# Patient Record
Sex: Female | Born: 1965 | Race: White | Hispanic: No | Marital: Married | Smoking: Former smoker
Health system: Southern US, Community
[De-identification: ages and names within clinical notes are randomized; demographics above are authoritative.]

## PROBLEM LIST (undated history)

## (undated) DIAGNOSIS — M810 Age-related osteoporosis without current pathological fracture: Secondary | ICD-10-CM

## (undated) DIAGNOSIS — F329 Major depressive disorder, single episode, unspecified: Secondary | ICD-10-CM

## (undated) DIAGNOSIS — F419 Anxiety disorder, unspecified: Secondary | ICD-10-CM

## (undated) DIAGNOSIS — F32A Depression, unspecified: Secondary | ICD-10-CM

## (undated) HISTORY — PX: NECK SURGERY: SHX720

## (undated) HISTORY — PX: ENDOMETRIAL ABLATION: SHX621

## (undated) HISTORY — PX: BACK SURGERY: SHX140

## (undated) HISTORY — DX: Major depressive disorder, single episode, unspecified: F32.9

## (undated) HISTORY — DX: Anxiety disorder, unspecified: F41.9

## (undated) HISTORY — DX: Age-related osteoporosis without current pathological fracture: M81.0

## (undated) HISTORY — PX: GALLBLADDER SURGERY: SHX652

## (undated) HISTORY — DX: Depression, unspecified: F32.A

## (undated) HISTORY — PX: HYSTEROTOMY: SHX1776

## (undated) HISTORY — PX: KNEE SURGERY: SHX244

---

## 2001-02-07 ENCOUNTER — Other Ambulatory Visit: Admission: RE | Admit: 2001-02-07 | Discharge: 2001-02-07 | Payer: Self-pay | Admitting: Obstetrics and Gynecology

## 2018-02-08 ENCOUNTER — Ambulatory Visit (INDEPENDENT_AMBULATORY_CARE_PROVIDER_SITE_OTHER): Payer: BLUE CROSS/BLUE SHIELD | Admitting: Obstetrics & Gynecology

## 2018-02-08 ENCOUNTER — Encounter (INDEPENDENT_AMBULATORY_CARE_PROVIDER_SITE_OTHER): Payer: Self-pay

## 2018-02-08 ENCOUNTER — Encounter: Payer: Self-pay | Admitting: Obstetrics & Gynecology

## 2018-02-08 VITALS — BP 129/77 | HR 96 | Ht 65.5 in | Wt 141.5 lb

## 2018-02-08 DIAGNOSIS — N816 Rectocele: Secondary | ICD-10-CM

## 2018-02-08 DIAGNOSIS — N8110 Cystocele, unspecified: Secondary | ICD-10-CM | POA: Diagnosis not present

## 2018-02-08 NOTE — Progress Notes (Signed)
Chief Complaint  Patient presents with  . thinks bladder has fallen    low back pain      52 y.o. [id number] No LMP recorded. Patient has had an ablation. The current method of family planning is status post menopause  Outpatient Encounter Medications as of 02/08/2018  Medication Sig  . HYDROcodone-acetaminophen (NORCO) 10-325 MG tablet Take 0.5 tablets by mouth as needed.    No facility-administered encounter medications on file as of 02/08/2018.     Subjective Pt presents with 2-3 month history of feeling like something is falling [place] Sp[place]iel was told years ago she had "prolapse" Has seen something she thinks is her bladder No dysparunia No bleeding She went through some vasomotor about 10 years ago Does not lose urine frequently, once a week or so Past Medical History:  Diagnosis Date  . Anxiety   . Depression   . Osteoporosis     Past Surgical History:  Procedure Laterality Date  . ABLATION    . BACK SURGERY     x 2  . GALLBLADDER SURGERY    . KNEE SURGERY Left   . NECK SURGERY      OB History    Gravida  4   Para  4   Term  3   Preterm  1   AB      Living  4     SAB      TAB      Ectopic      Multiple      Live Births  4           No Known Allergies  Social History   Socioeconomic History  . Marital status: Married    Spouse name: Not on file  . Number of children: Not on file  . Years of education: Not on file  . Highest education level: Not on file  Occupational History  . Not on file  Social Needs  . Financial resource strain: Not on file  . Food insecurity:    Worry: Not on file    Inability: Not on file  . Transportation needs:    Medical: Not on file    Non-medical: Not on file  Tobacco Use  . Smoking status: Current Every Day Smoker    Years: 30.00    Types: Cigarettes  . Smokeless tobacco: Never Used  . Tobacco comment: smokes 5 cig a day  Substance and Sexual Activity  . Alcohol use: Not Currently  . Drug  use: Not Currently    Types: Marijuana  . Sexual activity: Not Currently    Birth control/protection: Surgical    Comment: ablation  Lifestyle  . Physical activity:    Days per week: Not on file    Minutes per session: Not on file  . Stress: Not on file  Relationships  . Social connections:    Talks on phone: Not on file    Gets together: Not on file    Attends religious service: Not on file    Active member of club or organization: Not on file    Attends meetings of clubs or organizations: Not on file    Relationship status: Not on file  Other Topics Concern  . Not on file  Social History Narrative  . Not on file    Family History  Problem Relation Age of Onset  . Cancer Paternal Grandfather   . Heart attack Maternal Grandmother   . Heart attack Maternal Grandfather   .  Other Father        back problems  . Alzheimer's disease Mother     Medications:       Current Outpatient Medications:  .  HYDROcodone-acetaminophen (NORCO) 10-325 MG tablet, Take 0.5 tablets by mouth as needed. , Disp: , Rfl:   Objective Blood pressure 129/77, pulse 96, height 5' 5.5" (1.664 m), weight 141 lb 8 oz (64.2 kg).  General WDWN female NAD Vulva:  normal appearing vulva with no masses, tenderness or lesions Vagina:  normal mucosa, no lesions or discharge noted, grade II-III cystocoele and grade II-III rectoceoele Cervix:  no lesions and nulliparous appearance Uterus:  normal size, contour, position, consistency, mobility, non-tender Adnexa: ovaries:present,  normal adnexa in size, nontender and no masses Pessary fitting performed Milex ring with support #5 is best  Pertinent ROS No burning with urination, frequency or urgency No nausea, vomiting or diarrhea Nor fever chills or other constitutional symptoms   Labs or studies     Impression Diagnoses this Encounter::   ICD-10-CM   1. 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   Labs or Scans Ordered: No orders of the defined types were placed in this encounter.   Management:: >fit for pessary Milex ring with support #5  Follow up Return in about 1 month (around 03/11/2018) for Follow up, with Dr Despina Hidden.     All questions were answered.

## 2018-03-14 ENCOUNTER — Ambulatory Visit: Payer: BLUE CROSS/BLUE SHIELD | Admitting: Obstetrics & Gynecology

## 2018-03-15 ENCOUNTER — Ambulatory Visit: Payer: BLUE CROSS/BLUE SHIELD | Admitting: Obstetrics & Gynecology

## 2019-03-14 ENCOUNTER — Other Ambulatory Visit: Payer: Self-pay

## 2019-03-14 DIAGNOSIS — Z20822 Contact with and (suspected) exposure to covid-19: Secondary | ICD-10-CM

## 2019-03-16 LAB — NOVEL CORONAVIRUS, NAA: SARS-CoV-2, NAA: NOT DETECTED

## 2019-03-20 ENCOUNTER — Telehealth: Payer: Self-pay | Admitting: *Deleted

## 2019-03-20 NOTE — Telephone Encounter (Signed)
Patient called and was given negative COVID -19 results  

## 2019-09-11 ENCOUNTER — Other Ambulatory Visit: Payer: Self-pay | Admitting: Neurosurgery

## 2019-09-11 ENCOUNTER — Telehealth: Payer: Self-pay | Admitting: Nurse Practitioner

## 2019-09-11 DIAGNOSIS — M79601 Pain in right arm: Secondary | ICD-10-CM

## 2019-09-11 NOTE — Telephone Encounter (Signed)
Phone call to patient to verify medication list and allergies for myelogram procedure. Pt aware she will not need to hold any medications for this procedure. Pre and post procedure instructions reviewed with pt. Pt verbalized understanding. 

## 2019-09-20 ENCOUNTER — Ambulatory Visit
Admission: RE | Admit: 2019-09-20 | Discharge: 2019-09-20 | Disposition: A | Discharge status: Home / Self Care | Payer: BC Managed Care – PPO | Source: Ambulatory Visit | Attending: Neurosurgery | Admitting: Neurosurgery

## 2019-09-20 DIAGNOSIS — M79601 Pain in right arm: Secondary | ICD-10-CM

## 2019-09-20 MED ORDER — ONDANSETRON HCL 4 MG/2ML IJ SOLN
4.0000 mg | Freq: Once | INTRAMUSCULAR | Status: AC
  Administered 2019-09-20: 4 mg via INTRAMUSCULAR

## 2019-09-20 MED ORDER — DIAZEPAM 5 MG PO TABS
5.0000 mg | ORAL_TABLET | Freq: Once | ORAL | Status: AC
  Administered 2019-09-20: 5 mg via ORAL

## 2019-09-20 MED ORDER — MEPERIDINE HCL 100 MG/ML IJ SOLN
75.0000 mg | Freq: Once | INTRAMUSCULAR | Status: AC
  Administered 2019-09-20: 75 mg via INTRAMUSCULAR

## 2019-09-20 MED ORDER — IOPAMIDOL (ISOVUE-M 300) INJECTION 61%
10.0000 mL | Freq: Once | INTRAMUSCULAR | Status: AC | PRN
  Administered 2019-09-20: 10 mL via INTRATHECAL

## 2019-09-20 NOTE — Discharge Instructions (Signed)

## 2020-03-07 DIAGNOSIS — M5412 Radiculopathy, cervical region: Secondary | ICD-10-CM | POA: Diagnosis not present

## 2020-03-07 DIAGNOSIS — M5459 Other low back pain: Secondary | ICD-10-CM | POA: Diagnosis not present

## 2020-03-08 DIAGNOSIS — M5412 Radiculopathy, cervical region: Secondary | ICD-10-CM | POA: Diagnosis not present

## 2020-03-08 DIAGNOSIS — M5459 Other low back pain: Secondary | ICD-10-CM | POA: Diagnosis not present

## 2020-03-13 DIAGNOSIS — M5412 Radiculopathy, cervical region: Secondary | ICD-10-CM | POA: Diagnosis not present

## 2020-03-13 DIAGNOSIS — M5459 Other low back pain: Secondary | ICD-10-CM | POA: Diagnosis not present

## 2020-03-15 DIAGNOSIS — M5459 Other low back pain: Secondary | ICD-10-CM | POA: Diagnosis not present

## 2020-03-15 DIAGNOSIS — M5412 Radiculopathy, cervical region: Secondary | ICD-10-CM | POA: Diagnosis not present

## 2020-03-22 DIAGNOSIS — M5412 Radiculopathy, cervical region: Secondary | ICD-10-CM | POA: Diagnosis not present

## 2020-03-22 DIAGNOSIS — M5459 Other low back pain: Secondary | ICD-10-CM | POA: Diagnosis not present

## 2020-03-26 DIAGNOSIS — M5412 Radiculopathy, cervical region: Secondary | ICD-10-CM | POA: Diagnosis not present

## 2020-03-26 DIAGNOSIS — M5459 Other low back pain: Secondary | ICD-10-CM | POA: Diagnosis not present

## 2020-04-04 DIAGNOSIS — Z1211 Encounter for screening for malignant neoplasm of colon: Secondary | ICD-10-CM | POA: Diagnosis not present

## 2020-04-09 DIAGNOSIS — Z1231 Encounter for screening mammogram for malignant neoplasm of breast: Secondary | ICD-10-CM | POA: Diagnosis not present

## 2020-04-15 DIAGNOSIS — M5459 Other low back pain: Secondary | ICD-10-CM | POA: Diagnosis not present

## 2020-04-15 DIAGNOSIS — M5412 Radiculopathy, cervical region: Secondary | ICD-10-CM | POA: Diagnosis not present

## 2020-04-22 DIAGNOSIS — M5412 Radiculopathy, cervical region: Secondary | ICD-10-CM | POA: Diagnosis not present

## 2020-04-22 DIAGNOSIS — M5459 Other low back pain: Secondary | ICD-10-CM | POA: Diagnosis not present

## 2020-07-03 DIAGNOSIS — D2262 Melanocytic nevi of left upper limb, including shoulder: Secondary | ICD-10-CM | POA: Diagnosis not present

## 2020-07-03 DIAGNOSIS — D485 Neoplasm of uncertain behavior of skin: Secondary | ICD-10-CM | POA: Diagnosis not present

## 2020-07-03 DIAGNOSIS — D224 Melanocytic nevi of scalp and neck: Secondary | ICD-10-CM | POA: Diagnosis not present

## 2020-07-03 DIAGNOSIS — D239 Other benign neoplasm of skin, unspecified: Secondary | ICD-10-CM | POA: Diagnosis not present

## 2020-07-31 DIAGNOSIS — M4802 Spinal stenosis, cervical region: Secondary | ICD-10-CM | POA: Diagnosis not present

## 2020-07-31 DIAGNOSIS — M5412 Radiculopathy, cervical region: Secondary | ICD-10-CM | POA: Diagnosis not present

## 2020-08-08 ENCOUNTER — Other Ambulatory Visit: Payer: Self-pay | Admitting: Orthopedic Surgery

## 2020-08-08 ENCOUNTER — Other Ambulatory Visit: Payer: Self-pay | Admitting: Family Medicine

## 2020-08-08 DIAGNOSIS — M4802 Spinal stenosis, cervical region: Secondary | ICD-10-CM

## 2020-08-08 DIAGNOSIS — M5412 Radiculopathy, cervical region: Secondary | ICD-10-CM

## 2020-08-12 ENCOUNTER — Telehealth: Payer: Self-pay

## 2020-08-12 ENCOUNTER — Other Ambulatory Visit: Payer: Self-pay | Admitting: Orthopedic Surgery

## 2020-08-12 DIAGNOSIS — M4802 Spinal stenosis, cervical region: Secondary | ICD-10-CM

## 2020-08-12 NOTE — Telephone Encounter (Signed)
Phone call to patient to verify medication list and allergies for myelogram procedure. Pt instructed to hold tramadol  for 48hrs prior to myelogram appointment time and 24 hours after appointment. Pt also instructed to have a driver the day of the procedure, the procedure would take around 2 hours, and discharge instructions discussed. Pt verbalized understanding.

## 2020-08-15 ENCOUNTER — Ambulatory Visit
Admission: RE | Admit: 2020-08-15 | Discharge: 2020-08-15 | Disposition: A | Discharge status: Home / Self Care | Payer: BC Managed Care – PPO | Source: Ambulatory Visit | Attending: Orthopedic Surgery | Admitting: Orthopedic Surgery

## 2020-08-15 ENCOUNTER — Other Ambulatory Visit: Payer: Self-pay

## 2020-08-15 DIAGNOSIS — M4802 Spinal stenosis, cervical region: Secondary | ICD-10-CM

## 2020-08-15 DIAGNOSIS — M5124 Other intervertebral disc displacement, thoracic region: Secondary | ICD-10-CM | POA: Diagnosis not present

## 2020-08-15 DIAGNOSIS — M4322 Fusion of spine, cervical region: Secondary | ICD-10-CM | POA: Diagnosis not present

## 2020-08-15 DIAGNOSIS — M47812 Spondylosis without myelopathy or radiculopathy, cervical region: Secondary | ICD-10-CM | POA: Diagnosis not present

## 2020-08-15 DIAGNOSIS — M546 Pain in thoracic spine: Secondary | ICD-10-CM | POA: Diagnosis not present

## 2020-08-15 MED ORDER — ONDANSETRON HCL 4 MG/2ML IJ SOLN
4.0000 mg | Freq: Once | INTRAMUSCULAR | Status: AC
  Administered 2020-08-15: 4 mg via INTRAMUSCULAR

## 2020-08-15 MED ORDER — MEPERIDINE HCL 50 MG/ML IJ SOLN
50.0000 mg | Freq: Once | INTRAMUSCULAR | Status: AC
  Administered 2020-08-15: 50 mg via INTRAMUSCULAR

## 2020-08-15 MED ORDER — DIAZEPAM 5 MG PO TABS
10.0000 mg | ORAL_TABLET | Freq: Once | ORAL | Status: AC
  Administered 2020-08-15: 10 mg via ORAL

## 2020-08-15 MED ORDER — IOPAMIDOL (ISOVUE-M 300) INJECTION 61%
10.0000 mL | Freq: Once | INTRAMUSCULAR | Status: AC | PRN
  Administered 2020-08-15: 10 mL via INTRATHECAL

## 2020-08-15 NOTE — Discharge Instructions (Signed)
Myelogram Discharge Instructions  1. Go home and rest quietly for the next 24 hours.  It is important to lie flat for the next 24 hours.  Get up only to go to the restroom.  You may lie in the bed or on a couch on your back, your stomach, your left side or your right side.  You may have one pillow under your head.  You may have pillows between your knees while you are on your side or under your knees while you are on your back.  2. DO NOT drive today.  Recline the seat as far back as it will go, while still wearing your seat belt, on the way home.  3. You may get up to go to the bathroom as needed.  You may sit up for 10 minutes to eat.  You may resume your normal diet and medications unless otherwise indicated.  Drink lots of extra fluids today and tomorrow.  4. The incidence of headache, nausea, or vomiting is about 5% (one in 20 patients).  If you develop a headache, lie flat and drink plenty of fluids until the headache goes away.  Caffeinated beverages may be helpful.  If you develop severe nausea and vomiting or a headache that does not go away with flat bed rest, call [phone].  5. You may resume normal activities after your 24 hours of bed rest is over; however, do not exert yourself strongly or do any heavy lifting tomorrow. If when you get up you have a headache when standing, go back to bed and force fluids for another 24 hours.  6. Call your physician for a follow-up appointment.  The results of your myelogram will be sent directly to your physician by the following day.  7. If you have any questions or if complications develop after you arrive home, please call [phone].  Discharge instructions have been explained to the patient.  The patient, or the person responsible for the patient, fully understands these instructions   YOU MAY RESUME YOUR TRAMADOL TOMORROW 08/16/20 AT 1:30 PM

## 2020-08-15 NOTE — Discharge Instr - Other Orders (Signed)
Pt reported pain 7/10 prior to starting myelogram procedure. Pt reported this pain to Dr. Mosetta Putt. Pain medication was given prior to starting myelogram procedure. See MAR.

## 2020-08-15 NOTE — Progress Notes (Signed)
Pt reports she has been off of her Tramadol for at least 48 hours.  

## 2020-09-16 DIAGNOSIS — M84622A Pathological fracture in other disease, left humerus, initial encounter for fracture: Secondary | ICD-10-CM | POA: Diagnosis not present

## 2020-11-12 DIAGNOSIS — M542 Cervicalgia: Secondary | ICD-10-CM | POA: Diagnosis not present

## 2020-11-12 DIAGNOSIS — Z299 Encounter for prophylactic measures, unspecified: Secondary | ICD-10-CM | POA: Diagnosis not present

## 2020-11-25 DIAGNOSIS — M542 Cervicalgia: Secondary | ICD-10-CM | POA: Diagnosis not present

## 2020-11-25 DIAGNOSIS — M5031 Other cervical disc degeneration,  high cervical region: Secondary | ICD-10-CM | POA: Diagnosis not present

## 2020-11-25 DIAGNOSIS — M50221 Other cervical disc displacement at C4-C5 level: Secondary | ICD-10-CM | POA: Diagnosis not present

## 2020-11-25 DIAGNOSIS — Z981 Arthrodesis status: Secondary | ICD-10-CM | POA: Diagnosis not present

## 2020-12-05 DIAGNOSIS — M5030 Other cervical disc degeneration, unspecified cervical region: Secondary | ICD-10-CM | POA: Diagnosis not present

## 2020-12-05 DIAGNOSIS — M961 Postlaminectomy syndrome, not elsewhere classified: Secondary | ICD-10-CM | POA: Diagnosis not present

## 2020-12-05 DIAGNOSIS — M5412 Radiculopathy, cervical region: Secondary | ICD-10-CM | POA: Diagnosis not present

## 2020-12-06 DIAGNOSIS — M5187 Other intervertebral disc disorders, lumbosacral region: Secondary | ICD-10-CM | POA: Diagnosis not present

## 2020-12-06 DIAGNOSIS — M4816 Ankylosing hyperostosis [Forestier], lumbar region: Secondary | ICD-10-CM | POA: Diagnosis not present

## 2020-12-06 DIAGNOSIS — M5416 Radiculopathy, lumbar region: Secondary | ICD-10-CM | POA: Diagnosis not present

## 2020-12-06 DIAGNOSIS — M5136 Other intervertebral disc degeneration, lumbar region: Secondary | ICD-10-CM | POA: Diagnosis not present

## 2020-12-06 DIAGNOSIS — M50123 Cervical disc disorder at C6-C7 level with radiculopathy: Secondary | ICD-10-CM | POA: Diagnosis not present

## 2021-01-13 DIAGNOSIS — R5383 Other fatigue: Secondary | ICD-10-CM | POA: Diagnosis not present

## 2021-01-13 DIAGNOSIS — Z1331 Encounter for screening for depression: Secondary | ICD-10-CM | POA: Diagnosis not present

## 2021-01-13 DIAGNOSIS — F17210 Nicotine dependence, cigarettes, uncomplicated: Secondary | ICD-10-CM | POA: Diagnosis not present

## 2021-01-13 DIAGNOSIS — Z299 Encounter for prophylactic measures, unspecified: Secondary | ICD-10-CM | POA: Diagnosis not present

## 2021-01-13 DIAGNOSIS — F319 Bipolar disorder, unspecified: Secondary | ICD-10-CM | POA: Diagnosis not present

## 2021-01-13 DIAGNOSIS — E782 Mixed hyperlipidemia: Secondary | ICD-10-CM | POA: Diagnosis not present

## 2021-01-13 DIAGNOSIS — Z23 Encounter for immunization: Secondary | ICD-10-CM | POA: Diagnosis not present

## 2021-01-13 DIAGNOSIS — Z0000 Encounter for general adult medical examination without abnormal findings: Secondary | ICD-10-CM | POA: Diagnosis not present

## 2021-01-13 DIAGNOSIS — Z79899 Other long term (current) drug therapy: Secondary | ICD-10-CM | POA: Diagnosis not present

## 2021-01-20 DIAGNOSIS — R300 Dysuria: Secondary | ICD-10-CM | POA: Diagnosis not present

## 2021-02-06 DIAGNOSIS — Z1331 Encounter for screening for depression: Secondary | ICD-10-CM | POA: Diagnosis not present

## 2021-02-06 DIAGNOSIS — M549 Dorsalgia, unspecified: Secondary | ICD-10-CM | POA: Diagnosis not present

## 2021-02-06 DIAGNOSIS — M542 Cervicalgia: Secondary | ICD-10-CM | POA: Diagnosis not present

## 2021-02-06 DIAGNOSIS — E7800 Pure hypercholesterolemia, unspecified: Secondary | ICD-10-CM | POA: Diagnosis not present

## 2021-02-06 DIAGNOSIS — Z299 Encounter for prophylactic measures, unspecified: Secondary | ICD-10-CM | POA: Diagnosis not present

## 2021-02-06 DIAGNOSIS — F17210 Nicotine dependence, cigarettes, uncomplicated: Secondary | ICD-10-CM | POA: Diagnosis not present

## 2021-02-06 DIAGNOSIS — F339 Major depressive disorder, recurrent, unspecified: Secondary | ICD-10-CM | POA: Diagnosis not present

## 2021-03-05 DIAGNOSIS — Z6823 Body mass index (BMI) 23.0-23.9, adult: Secondary | ICD-10-CM | POA: Diagnosis not present

## 2021-03-05 DIAGNOSIS — Z981 Arthrodesis status: Secondary | ICD-10-CM | POA: Diagnosis not present

## 2021-03-05 DIAGNOSIS — M545 Low back pain, unspecified: Secondary | ICD-10-CM | POA: Diagnosis not present

## 2021-03-05 DIAGNOSIS — M542 Cervicalgia: Secondary | ICD-10-CM | POA: Diagnosis not present

## 2021-03-20 DIAGNOSIS — M542 Cervicalgia: Secondary | ICD-10-CM | POA: Diagnosis not present

## 2021-03-20 DIAGNOSIS — M5416 Radiculopathy, lumbar region: Secondary | ICD-10-CM | POA: Diagnosis not present

## 2021-03-20 DIAGNOSIS — M545 Low back pain, unspecified: Secondary | ICD-10-CM | POA: Diagnosis not present

## 2021-04-02 DIAGNOSIS — M5416 Radiculopathy, lumbar region: Secondary | ICD-10-CM | POA: Diagnosis not present

## 2021-04-17 DIAGNOSIS — M5416 Radiculopathy, lumbar region: Secondary | ICD-10-CM | POA: Diagnosis not present

## 2021-04-17 DIAGNOSIS — M47812 Spondylosis without myelopathy or radiculopathy, cervical region: Secondary | ICD-10-CM | POA: Diagnosis not present

## 2021-05-06 DIAGNOSIS — M47812 Spondylosis without myelopathy or radiculopathy, cervical region: Secondary | ICD-10-CM | POA: Diagnosis not present

## 2021-05-21 DIAGNOSIS — M542 Cervicalgia: Secondary | ICD-10-CM | POA: Diagnosis not present

## 2021-05-21 DIAGNOSIS — M549 Dorsalgia, unspecified: Secondary | ICD-10-CM | POA: Diagnosis not present

## 2021-05-21 DIAGNOSIS — Z299 Encounter for prophylactic measures, unspecified: Secondary | ICD-10-CM | POA: Diagnosis not present

## 2021-05-21 DIAGNOSIS — Z6824 Body mass index (BMI) 24.0-24.9, adult: Secondary | ICD-10-CM | POA: Diagnosis not present

## 2021-05-21 DIAGNOSIS — R110 Nausea: Secondary | ICD-10-CM | POA: Diagnosis not present

## 2021-05-21 DIAGNOSIS — F17210 Nicotine dependence, cigarettes, uncomplicated: Secondary | ICD-10-CM | POA: Diagnosis not present

## 2021-05-21 DIAGNOSIS — K219 Gastro-esophageal reflux disease without esophagitis: Secondary | ICD-10-CM | POA: Diagnosis not present

## 2021-05-22 ENCOUNTER — Encounter (INDEPENDENT_AMBULATORY_CARE_PROVIDER_SITE_OTHER): Payer: Self-pay | Admitting: *Deleted

## 2021-05-26 DIAGNOSIS — M5416 Radiculopathy, lumbar region: Secondary | ICD-10-CM | POA: Diagnosis not present

## 2021-06-04 DIAGNOSIS — M47812 Spondylosis without myelopathy or radiculopathy, cervical region: Secondary | ICD-10-CM | POA: Diagnosis not present

## 2021-07-07 DIAGNOSIS — L570 Actinic keratosis: Secondary | ICD-10-CM | POA: Diagnosis not present

## 2021-07-07 DIAGNOSIS — D239 Other benign neoplasm of skin, unspecified: Secondary | ICD-10-CM | POA: Diagnosis not present

## 2021-07-21 ENCOUNTER — Ambulatory Visit (INDEPENDENT_AMBULATORY_CARE_PROVIDER_SITE_OTHER): Payer: BC Managed Care – PPO | Admitting: Gastroenterology

## 2021-07-21 ENCOUNTER — Other Ambulatory Visit: Payer: Self-pay

## 2021-07-21 ENCOUNTER — Encounter (INDEPENDENT_AMBULATORY_CARE_PROVIDER_SITE_OTHER): Payer: Self-pay | Admitting: Gastroenterology

## 2021-07-21 ENCOUNTER — Other Ambulatory Visit (INDEPENDENT_AMBULATORY_CARE_PROVIDER_SITE_OTHER): Payer: Self-pay

## 2021-07-21 ENCOUNTER — Encounter (INDEPENDENT_AMBULATORY_CARE_PROVIDER_SITE_OTHER): Payer: Self-pay

## 2021-07-21 VITALS — BP 121/74 | HR 102 | Temp 98.8°F | Ht 65.5 in | Wt 148.2 lb

## 2021-07-21 DIAGNOSIS — R112 Nausea with vomiting, unspecified: Secondary | ICD-10-CM | POA: Insufficient documentation

## 2021-07-21 DIAGNOSIS — R110 Nausea: Secondary | ICD-10-CM | POA: Insufficient documentation

## 2021-07-21 DIAGNOSIS — R142 Eructation: Secondary | ICD-10-CM | POA: Diagnosis not present

## 2021-07-21 DIAGNOSIS — R1013 Epigastric pain: Secondary | ICD-10-CM | POA: Diagnosis not present

## 2021-07-21 NOTE — Patient Instructions (Signed)
Please continue your protonix 40mg  twice a day at this time ?Please avoid NSAIDs (advil, aleve, naproxen, goody powder, ibuprofen) as these can be very hard on your GI tract, causing inflammation, ulcers and damage to the lining of your GI tract.  ?We will get you scheduled for an upper endoscopy for further evaluation of your symptoms, it is possible you have an ulcer or gastritis.  ?I will also obtain records from your previous colonoscopy so that we can make sure you are up to date on this ?

## 2021-07-21 NOTE — Progress Notes (Signed)
? ?Referring Provider: Ignatius Specking, MD ?Primary Care Physician:  Ignatius Specking, MD ?Primary GI Physician: new ? ?Chief Complaint  ?Patient presents with  ? Nausea  ?  Patient referred for nausea, gas, burping. Taking pantoprazole 40mg  bid.   ? ?HPI:   ?Kathryn Berry is a 56 y.o. female with past medical history of anxiety, depression, osteoporosis. ? ?Patient presenting today as new patient for nausea, epigastric discomfort and belching. ? ?Patient states for the past 8 months she has had some nausea, epigastric discomfort and belching. Has started a probiotic and drinking more water which she feels may have helped some. She also cut out sugar and feels that symptoms have improved some over the past week since doing that. She denies any abdominal pain but notes a discomfort in her epigastric region. States that eating tends to make her symptoms better. She also notes more belching at times. Denies actual heartburn or acid regurgitation. Denies constipation or diarrhea, rectal bleeding, melena, dysphagia, odynophagia early satiety or bloating. States She was not taking any medications when symptoms began. She has had no episodes of vomiting. Symptoms seems to be worse at night when lying down so she has elevated HOB at night. Does not note much change in symptoms since starting pantoprazole, PCP recently increased to BID to see if this provided any results. She states that she recently quit smoking 2 months ago, does note that she feels a slight sore throat at times and notes some drainage in the back of her throat. Does not feel that certain foods tend to worsen her symptoms. ? ?NSAID use: no frequent NSAID use ?Social hx: recently stopped smoking, does not drink alcohol. ?Fam 41 grandfather had colon cancer ? ?Last Colonoscopy:04/04/20 [facility], unable to review records in EMR ?Last Endoscopy:never ? ? ?Past Medical History:  ?Diagnosis Date  ? Anxiety   ? Depression   ? Osteoporosis   ? ?Past  Surgical History:  ?Procedure Laterality Date  ? BACK SURGERY    ? x 3  ? ENDOMETRIAL ABLATION    ? GALLBLADDER SURGERY    ? HYSTEROTOMY    ? KNEE SURGERY Left   ? NECK SURGERY    ? x 3  ? ? ?Current Outpatient Medications  ?Medication Sig Dispense Refill  ? pantoprazole (PROTONIX) 40 MG tablet Take 40 mg by mouth 2 (two) times daily.    ? pregabalin (LYRICA) 50 MG capsule Take by mouth. Takes 3 daily    ? Probiotic Product (PROBIOTIC DAILY PO) Take by mouth. One daily    ? ?No current facility-administered medications for this visit.  ? ? ?Allergies as of 07/21/2021  ? (No Known Allergies)  ? ?Family History  ?Problem Relation Age of Onset  ? Cancer Paternal Grandfather   ? Heart attack Maternal Grandmother   ? Heart attack Maternal Grandfather   ? Other Father   ?     back problems  ? Alzheimer's disease Mother   ? ?Social History  ? ?Socioeconomic History  ? Marital status: Married  ?  Spouse name: Not on file  ? Number of children: Not on file  ? Years of education: Not on file  ? Highest education level: Not on file  ?Occupational History  ? Not on file  ?Tobacco Use  ? Smoking status: Former  ?  Years: 30.00  ?  Types: Cigarettes  ?  Quit date: 06/2021  ?  Years since quitting: 0.1  ?  Passive exposure: Past  ?  Smokeless tobacco: Never  ?Substance and Sexual Activity  ? Alcohol use: Not Currently  ? Drug use: Not Currently  ?  Types: Marijuana  ? Sexual activity: Not Currently  ?  Birth control/protection: Surgical  ?  Comment: ablation  ?Other Topics Concern  ? Not on file  ?Social History Narrative  ? Not on file  ? ?Social Determinants of Health  ? ?Financial Resource Strain: Not on file  ?Food Insecurity: Not on file  ?Transportation Needs: Not on file  ?Physical Activity: Not on file  ?Stress: Not on file  ?Social Connections: Not on file  ? ?Review of systems ?General: negative for malaise, night sweats, fever, chills, weight loss ?Neck: Negative for lumps, goiter, pain and significant neck  swelling ?Resp: Negative for cough, wheezing, dyspnea at rest ?CV: Negative for chest pain, leg swelling, palpitations, orthopnea ?GI: denies melena, hematochezia, vomiting, diarrhea, constipation, dysphagia, odyonophagia, early satiety or unintentional weight loss. +nausea +epigastric discomfort ?MSK: Negative for joint pain or swelling, back pain, and muscle pain. ?Derm: Negative for itching or rash ?Psych: Denies depression, anxiety, memory loss, confusion. No homicidal or suicidal ideation.  ?Heme: Negative for prolonged bleeding, bruising easily, and swollen nodes. ?Endocrine: Negative for cold or heat intolerance, polyuria, polydipsia and goiter. ?Neuro: negative for tremor, gait imbalance, syncope and seizures. ?The remainder of the review of systems is noncontributory. ? ?Physical Exam: ?BP 121/74 (BP Location: Right Arm, Patient Position: Sitting, Cuff Size: Large)   Pulse (!) 102   Temp 98.8 ?F (37.1 ?C) (Oral)   Ht 5' 5.5" (1.664 m)   Wt 148 lb 3.2 oz (67.2 kg)   BMI 24.29 kg/m?  ?General:   Alert and oriented. No distress noted. Pleasant and cooperative.  ?Head:  Normocephalic and atraumatic. ?Eyes:  Conjuctiva clear without scleral icterus. ?Mouth:  Oral mucosa pink and moist. Good dentition. No lesions. ?Heart: Normal rate and rhythm, s1 and s2 heart sounds present.  ?Lungs: Clear lung sounds in all lobes. Respirations equal and unlabored. ?Abdomen:  +BS, soft, non-tender and non-distended. No rebound or guarding. No HSM or masses noted. ?Derm: No palmar erythema or jaundice ?Msk:  Symmetrical without gross deformities. Normal posture. ?Extremities:  Without edema. ?Neurologic:  Alert and  oriented x4 ?Psych:  Alert and cooperative. Normal mood and affect. ? ?Invalid input(s): 6 MONTHS  ? ?ASSESSMENT: ?Kathryn Berry is a 56 y.o. female presenting today as a new patient for epigastric discomfort, nausea and belching.  ? ?Symptoms began about 8 months ago, denies GERD symptoms though does have  some soreness in her throat on occasion and feels that she has some drainage. Epigastric pain sometimes improves with eating. Does not feel that any specific foods trigger her symptoms, though they tend to be worse at night when lying down. She does not drink or take any NSAIDs. No rectal bleeding, melena, early satiety or weight loss. Denies any dysphagia or odynophagia. Currently on PPI BID, is not sure if this has provided much improvement in symptoms, we can continue with BID dosing at this time, further recommendations to follow after endoscopic evaluation. We will schedule EGD for further evaluation, though symptoms could be related to uncontrolled GERD, gastritis or PUD. Indications, risks and benefits of procedure discussed in detail with patient. Patient verbalized understanding and is in agreement to proceed with EGD at this time.  ? ?It appears last colonoscopy was in 2021 at [facility], we will attempt to obtain these records for her EMR and ensure she is up to   date on screening as she has hx of colon polyps.  ? ? ?PLAN:  ?Obtain colonoscopy records from [facility] ?2. Continue PPI BID for now ?3. Schedule EGD ?4. Continue to Avoid NSAIDs ? ?All questions were answered, patient verbalized understanding and is in agreement with plan as outline above.  ? ?Follow Up: ?TBD ? ?Darletta Noblett L. Jeanmarie Hubert, MSN, APRN, AGNP-C ?Adult-Gerontology Nurse Practitioner ?[facility] ? ?

## 2021-07-21 NOTE — H&P (View-Only) (Signed)
? ?Referring Provider: Ignatius Specking, MD ?Primary Care Physician:  Ignatius Specking, MD ?Primary GI Physician: new ? ?Chief Complaint  ?Patient presents with  ? Nausea  ?  Patient referred for nausea, gas, burping. Taking pantoprazole 40mg  bid.   ? ?HPI:   ?Kathryn Berry is a 56 y.o. female with past medical history of anxiety, depression, osteoporosis. ? ?Patient presenting today as new patient for nausea, epigastric discomfort and belching. ? ?Patient states for the past 8 months she has had some nausea, epigastric discomfort and belching. Has started a probiotic and drinking more water which she feels may have helped some. She also cut out sugar and feels that symptoms have improved some over the past week since doing that. She denies any abdominal pain but notes a discomfort in her epigastric region. States that eating tends to make her symptoms better. She also notes more belching at times. Denies actual heartburn or acid regurgitation. Denies constipation or diarrhea, rectal bleeding, melena, dysphagia, odynophagia early satiety or bloating. States She was not taking any medications when symptoms began. She has had no episodes of vomiting. Symptoms seems to be worse at night when lying down so she has elevated HOB at night. Does not note much change in symptoms since starting pantoprazole, PCP recently increased to BID to see if this provided any results. She states that she recently quit smoking 2 months ago, does note that she feels a slight sore throat at times and notes some drainage in the back of her throat. Does not feel that certain foods tend to worsen her symptoms. ? ?NSAID use: no frequent NSAID use ?Social hx: recently stopped smoking, does not drink alcohol. ?Fam 41 grandfather had colon cancer ? ?Last Colonoscopy:04/04/20 [facility], unable to review records in EMR ?Last Endoscopy:never ? ? ?Past Medical History:  ?Diagnosis Date  ? Anxiety   ? Depression   ? Osteoporosis   ? ?Past  Surgical History:  ?Procedure Laterality Date  ? BACK SURGERY    ? x 3  ? ENDOMETRIAL ABLATION    ? GALLBLADDER SURGERY    ? HYSTEROTOMY    ? KNEE SURGERY Left   ? NECK SURGERY    ? x 3  ? ? ?Current Outpatient Medications  ?Medication Sig Dispense Refill  ? pantoprazole (PROTONIX) 40 MG tablet Take 40 mg by mouth 2 (two) times daily.    ? pregabalin (LYRICA) 50 MG capsule Take by mouth. Takes 3 daily    ? Probiotic Product (PROBIOTIC DAILY PO) Take by mouth. One daily    ? ?No current facility-administered medications for this visit.  ? ? ?Allergies as of 07/21/2021  ? (No Known Allergies)  ? ?Family History  ?Problem Relation Age of Onset  ? Cancer Paternal Grandfather   ? Heart attack Maternal Grandmother   ? Heart attack Maternal Grandfather   ? Other Father   ?     back problems  ? Alzheimer's disease Mother   ? ?Social History  ? ?Socioeconomic History  ? Marital status: Married  ?  Spouse name: Not on file  ? Number of children: Not on file  ? Years of education: Not on file  ? Highest education level: Not on file  ?Occupational History  ? Not on file  ?Tobacco Use  ? Smoking status: Former  ?  Years: 30.00  ?  Types: Cigarettes  ?  Quit date: 06/2021  ?  Years since quitting: 0.1  ?  Passive exposure: Past  ?  Smokeless tobacco: Never  ?Substance and Sexual Activity  ? Alcohol use: Not Currently  ? Drug use: Not Currently  ?  Types: Marijuana  ? Sexual activity: Not Currently  ?  Birth control/protection: Surgical  ?  Comment: ablation  ?Other Topics Concern  ? Not on file  ?Social History Narrative  ? Not on file  ? ?Social Determinants of Health  ? ?Financial Resource Strain: Not on file  ?Food Insecurity: Not on file  ?Transportation Needs: Not on file  ?Physical Activity: Not on file  ?Stress: Not on file  ?Social Connections: Not on file  ? ?Review of systems ?General: negative for malaise, night sweats, fever, chills, weight loss ?Neck: Negative for lumps, goiter, pain and significant neck  swelling ?Resp: Negative for cough, wheezing, dyspnea at rest ?CV: Negative for chest pain, leg swelling, palpitations, orthopnea ?GI: denies melena, hematochezia, vomiting, diarrhea, constipation, dysphagia, odyonophagia, early satiety or unintentional weight loss. +nausea +epigastric discomfort ?MSK: Negative for joint pain or swelling, back pain, and muscle pain. ?Derm: Negative for itching or rash ?Psych: Denies depression, anxiety, memory loss, confusion. No homicidal or suicidal ideation.  ?Heme: Negative for prolonged bleeding, bruising easily, and swollen nodes. ?Endocrine: Negative for cold or heat intolerance, polyuria, polydipsia and goiter. ?Neuro: negative for tremor, gait imbalance, syncope and seizures. ?The remainder of the review of systems is noncontributory. ? ?Physical Exam: ?BP 121/74 (BP Location: Right Arm, Patient Position: Sitting, Cuff Size: Large)   Pulse (!) 102   Temp 98.8 ?F (37.1 ?C) (Oral)   Ht 5' 5.5" (1.664 m)   Wt 148 lb 3.2 oz (67.2 kg)   BMI 24.29 kg/m?  ?General:   Alert and oriented. No distress noted. Pleasant and cooperative.  ?Head:  Normocephalic and atraumatic. ?Eyes:  Conjuctiva clear without scleral icterus. ?Mouth:  Oral mucosa pink and moist. Good dentition. No lesions. ?Heart: Normal rate and rhythm, s1 and s2 heart sounds present.  ?Lungs: Clear lung sounds in all lobes. Respirations equal and unlabored. ?Abdomen:  +BS, soft, non-tender and non-distended. No rebound or guarding. No HSM or masses noted. ?Derm: No palmar erythema or jaundice ?Msk:  Symmetrical without gross deformities. Normal posture. ?Extremities:  Without edema. ?Neurologic:  Alert and  oriented x4 ?Psych:  Alert and cooperative. Normal mood and affect. ? ?Invalid input(s): 6 MONTHS  ? ?ASSESSMENT: ?Kathryn Berry is a 56 y.o. female presenting today as a new patient for epigastric discomfort, nausea and belching.  ? ?Symptoms began about 8 months ago, denies GERD symptoms though does have  some soreness in her throat on occasion and feels that she has some drainage. Epigastric pain sometimes improves with eating. Does not feel that any specific foods trigger her symptoms, though they tend to be worse at night when lying down. She does not drink or take any NSAIDs. No rectal bleeding, melena, early satiety or weight loss. Denies any dysphagia or odynophagia. Currently on PPI BID, is not sure if this has provided much improvement in symptoms, we can continue with BID dosing at this time, further recommendations to follow after endoscopic evaluation. We will schedule EGD for further evaluation, though symptoms could be related to uncontrolled GERD, gastritis or PUD. Indications, risks and benefits of procedure discussed in detail with patient. Patient verbalized understanding and is in agreement to proceed with EGD at this time.  ? ?It appears last colonoscopy was in 2021 at [facility], we will attempt to obtain these records for her EMR and ensure she is up to  date on screening as she has hx of colon polyps.  ? ? ?PLAN:  ?Obtain colonoscopy records from [facility] ?2. Continue PPI BID for now ?3. Schedule EGD ?4. Continue to Avoid NSAIDs ? ?All questions were answered, patient verbalized understanding and is in agreement with plan as outline above.  ? ?Follow Up: ?TBD ? ?Gayle Martinez L. Jeanmarie Hubert, MSN, APRN, AGNP-C ?Adult-Gerontology Nurse Practitioner ?[facility] ? ?

## 2021-07-22 ENCOUNTER — Other Ambulatory Visit (INDEPENDENT_AMBULATORY_CARE_PROVIDER_SITE_OTHER): Payer: Self-pay

## 2021-07-22 DIAGNOSIS — Z01812 Encounter for preprocedural laboratory examination: Secondary | ICD-10-CM

## 2021-07-30 ENCOUNTER — Encounter (HOSPITAL_COMMUNITY): Payer: Self-pay | Admitting: Gastroenterology

## 2021-07-30 ENCOUNTER — Ambulatory Visit (HOSPITAL_COMMUNITY)
Admission: RE | Admit: 2021-07-30 | Discharge: 2021-07-30 | Disposition: A | Discharge status: Home / Self Care | Payer: BC Managed Care – PPO | Source: Ambulatory Visit | Attending: Gastroenterology | Admitting: Gastroenterology

## 2021-07-30 ENCOUNTER — Other Ambulatory Visit: Payer: Self-pay

## 2021-07-30 ENCOUNTER — Ambulatory Visit (HOSPITAL_COMMUNITY): Payer: BC Managed Care – PPO | Admitting: Certified Registered"

## 2021-07-30 ENCOUNTER — Encounter (HOSPITAL_COMMUNITY)
Admission: RE | Disposition: A | Discharge status: Home / Self Care | Payer: Self-pay | Source: Ambulatory Visit | Attending: Gastroenterology

## 2021-07-30 DIAGNOSIS — K31819 Angiodysplasia of stomach and duodenum without bleeding: Secondary | ICD-10-CM | POA: Diagnosis not present

## 2021-07-30 DIAGNOSIS — Z87891 Personal history of nicotine dependence: Secondary | ICD-10-CM | POA: Diagnosis not present

## 2021-07-30 DIAGNOSIS — R110 Nausea: Secondary | ICD-10-CM | POA: Diagnosis not present

## 2021-07-30 DIAGNOSIS — R1013 Epigastric pain: Secondary | ICD-10-CM | POA: Insufficient documentation

## 2021-07-30 DIAGNOSIS — R197 Diarrhea, unspecified: Secondary | ICD-10-CM | POA: Insufficient documentation

## 2021-07-30 DIAGNOSIS — K449 Diaphragmatic hernia without obstruction or gangrene: Secondary | ICD-10-CM | POA: Insufficient documentation

## 2021-07-30 HISTORY — PX: ESOPHAGOGASTRODUODENOSCOPY (EGD) WITH PROPOFOL: SHX5813

## 2021-07-30 HISTORY — PX: BIOPSY: SHX5522

## 2021-07-30 SURGERY — ESOPHAGOGASTRODUODENOSCOPY (EGD) WITH PROPOFOL
Anesthesia: General

## 2021-07-30 MED ORDER — LACTATED RINGERS IV SOLN: INTRAVENOUS | Status: DC

## 2021-07-30 MED ORDER — LIDOCAINE HCL (CARDIAC) PF 100 MG/5ML IV SOSY
PREFILLED_SYRINGE | INTRAVENOUS | Status: DC | PRN
  Administered 2021-07-30: 50 mg via INTRAVENOUS

## 2021-07-30 MED ORDER — PROPOFOL 10 MG/ML IV BOLUS
INTRAVENOUS | Status: DC | PRN
  Administered 2021-07-30: 40 mg via INTRAVENOUS
  Administered 2021-07-30: 100 mg via INTRAVENOUS

## 2021-07-30 MED ORDER — LACTATED RINGERS IV SOLN: INTRAVENOUS | Status: DC | PRN

## 2021-07-30 MED ORDER — PROPOFOL 500 MG/50ML IV EMUL
INTRAVENOUS | Status: DC | PRN
  Administered 2021-07-30: 150 ug/kg/min via INTRAVENOUS

## 2021-07-30 NOTE — Discharge Instructions (Signed)
You are being discharged to home.  Resume your previous diet.  We are waiting for your pathology results.  

## 2021-07-30 NOTE — Transfer of Care (Signed)
Immediate Anesthesia Transfer of Care Note ? ?Patient: Kathryn Berry ? ?Procedure(s) Performed: ESOPHAGOGASTRODUODENOSCOPY (EGD) WITH PROPOFOL ?BIOPSY ? ?Patient Location: Endoscopy Unit ? ?Anesthesia Type:General ? ?Level of Consciousness: drowsy ? ?Airway & Oxygen Therapy: Patient Spontanous Breathing ? ?Post-op Assessment: Report given to RN and Post -op Vital signs reviewed and stable ? ?Post vital signs: Reviewed and stable ? ?Last Vitals:  ?Vitals Value Taken Time  ?BP    ?Temp    ?Pulse    ?Resp    ?SpO2    ? ? ?Last Pain:  ?Vitals:  ? 07/30/21 0738  ?TempSrc:   ?PainSc: 0-No pain  ?   ? ?  ? ?Complications: No notable events documented. ?

## 2021-07-30 NOTE — Anesthesia Preprocedure Evaluation (Signed)
Anesthesia Evaluation  ?Patient identified by MRN, date of birth, ID band ?Patient awake ? ? ? ?Reviewed: ?Allergy & Precautions, H&P , NPO status , Patient's Chart, lab work & pertinent test results, reviewed documented beta blocker date and time  ? ?Airway ?Mallampati: II ? ?TM Distance: >3 FB ?Neck ROM: full ? ? ? Dental ?no notable dental hx. ? ?  ?Pulmonary ?neg pulmonary ROS, former smoker,  ?  ?Pulmonary exam normal ?breath sounds clear to auscultation ? ? ? ? ? ? Cardiovascular ?Exercise Tolerance: Good ?negative cardio ROS ? ? ?Rhythm:regular Rate:Normal ? ? ?  ?Neuro/Psych ?PSYCHIATRIC DISORDERS Anxiety Depression negative neurological ROS ?   ? GI/Hepatic ?negative GI ROS, Neg liver ROS,   ?Endo/Other  ?negative endocrine ROS ? Renal/GU ?negative Renal ROS  ?negative genitourinary ?  ?Musculoskeletal ? ? Abdominal ?  ?Peds ? Hematology ?negative hematology ROS ?(+)   ?Anesthesia Other Findings ? ? Reproductive/Obstetrics ?negative OB ROS ? ?  ? ? ? ? ? ? ? ? ? ? ? ? ? ?  ?  ? ? ? ? ? ? ? ? ?Anesthesia Physical ?Anesthesia Plan ? ?ASA: 2 ? ?Anesthesia Plan: General  ? ?Post-op Pain Management:   ? ?Induction:  ? ?PONV Risk Score and Plan: Propofol infusion ? ?Airway Management Planned:  ? ?Additional Equipment:  ? ?Intra-op Plan:  ? ?Post-operative Plan:  ? ?Informed Consent: I have reviewed the patients History and Physical, chart, labs and discussed the procedure including the risks, benefits and alternatives for the proposed anesthesia with the patient or authorized representative who has indicated his/her understanding and acceptance.  ? ? ? ?Dental Advisory Given ? ?Plan Discussed with: CRNA ? ?Anesthesia Plan Comments:   ? ? ? ? ? ? ?Anesthesia Quick Evaluation ? ?

## 2021-07-30 NOTE — Op Note (Signed)
[facility] ?Patient Name: Kathryn Berry ?Procedure Date: 07/30/2021 7:12 AM ?MRN: [id number] ?Date of Birth: Aug 09, 1965 ?Attending MD: Katrinka Blazing ,  ?CSN: [id number] ?Age: 56 ?Admit Type: Outpatient ?Procedure:                Upper GI endoscopy ?Indications:              Epigastric abdominal pain, Diarrhea, Nausea ?Providers:                Katrinka Blazing, Angelica Ran, Burke Keels,  ?                          Technician ?Referring MD:              ?Medicines:                Monitored Anesthesia Care ?Complications:            No immediate complications. ?Estimated Blood Loss:     Estimated blood loss: none. ?Procedure:                Pre-Anesthesia Assessment: ?                          - Prior to the procedure, a History and Physical  ?                          was performed, and patient medications, allergies  ?                          and sensitivities were reviewed. The patient's  ?                          tolerance of previous anesthesia was reviewed. ?                          - The risks and benefits of the procedure and the  ?                          sedation options and risks were discussed with the  ?                          patient. All questions were answered and informed  ?                          consent was obtained. ?                          - ASA Grade Assessment: II - A patient with mild  ?                          systemic disease. ?                          After obtaining informed consent, the endoscope was  ?                          passed under direct vision. Throughout the  ?  procedure, the patient's blood pressure, pulse, and  ?                          oxygen saturations were monitored continuously. The  ?                          [id number]-[id number] ([id number] scope was introduced through the  ?                          mouth, and advanced to the second part of duodenum.  ?                          The upper GI endoscopy was accomplished without  ?                           difficulty. The patient tolerated the procedure  ?                          well. ?Scope In: 7:40:22 AM ?Scope Out: 7:46:06 AM ?Total Procedure Duration: 0 hours 5 minutes 44 seconds  ?Findings: ?     A 2 cm hiatal hernia was found. The proximal extent of the gastric folds  ?     (end of tubular esophagus) was 39 cm from the incisors. The Z-line was  ?     37 cm from the incisors. ?     The entire examined stomach was normal. Biopsies were taken with a cold  ?     forceps for Helicobacter pylori testing. ?     The examined duodenum was normal. Biopsies were taken with a cold  ?     forceps for histology. ?Impression:               - 2 cm hiatal hernia. ?                          - Normal stomach. Biopsied. ?                          - Normal examined duodenum. Biopsied. ?Moderate Sedation: ?     Per Anesthesia Care ?Recommendation:           - Discharge patient to home (ambulatory). ?                          - Resume previous diet. ?                          - Await pathology results. ?                          - May consider abdominal pelvis CT depending on  ?                          biopsy results. ?Procedure Code(s):        --- Professional --- ?                          [phone], Esophagogastroduodenoscopy, flexible,  ?  transoral; with biopsy, single or multiple ?Diagnosis Code(s):        --- Professional --- ?                          K44.9, Diaphragmatic hernia without obstruction or  ?                          gangrene ?                          R10.13, Epigastric pain ?                          R19.7, Diarrhea, unspecified ?                          R11.0, Nausea ?CPT copyright 2019 American Medical Association. All rights reserved. ?The codes documented in this report are preliminary and upon coder review may  ?be revised to meet current compliance requirements. ?Katrinka Blazing, MD ?Katrinka Blazing,  ?07/30/2021 7:51:45 AM ?This report has been signed  electronically. ?Number of Addenda: 0 ?

## 2021-07-30 NOTE — Anesthesia Procedure Notes (Signed)
Date/Time: 07/30/2021 7:41 AM ?Performed by: Julian Reil, CRNA ?Pre-anesthesia Checklist: Patient identified, Emergency Drugs available, Suction available and Patient being monitored ?Patient Re-evaluated:Patient Re-evaluated prior to induction ?Oxygen Delivery Method: Nasal cannula ?Induction Type: IV induction ?Placement Confirmation: positive ETCO2 ? ? ? ? ?

## 2021-07-30 NOTE — Interval H&P Note (Signed)
History and Physical Interval Note: ? ?07/30/2021 ?7:31 AM ? ?Kathryn Berry  has presented today for surgery, with the diagnosis of Nausea epigastric pain.  The various methods of treatment have been discussed with the patient and family. After consideration of risks, benefits and other options for treatment, the patient has consented to  Procedure(s) with comments: ?ESOPHAGOGASTRODUODENOSCOPY (EGD) WITH PROPOFOL (N/A) - 730 as a surgical intervention.  The patient's history has been reviewed, patient examined, no change in status, stable for surgery.  I have reviewed the patient's chart and labs.  Questions were answered to the patient's satisfaction.   ? ? ?[name] ? ? ?

## 2021-07-30 NOTE — Anesthesia Postprocedure Evaluation (Signed)
Anesthesia Post Note ? ?Patient: Kathryn Berry ? ?Procedure(s) Performed: ESOPHAGOGASTRODUODENOSCOPY (EGD) WITH PROPOFOL ?BIOPSY ? ?Patient location during evaluation: Phase II ?Anesthesia Type: General ?Level of consciousness: awake ?Pain management: pain level controlled ?Vital Signs Assessment: post-procedure vital signs reviewed and stable ?Respiratory status: spontaneous breathing and respiratory function stable ?Cardiovascular status: blood pressure returned to baseline and stable ?Postop Assessment: no headache and no apparent nausea or vomiting ?Anesthetic complications: no ?Comments: Late entry ? ? ?No notable events documented. ? ? ?Last Vitals:  ?Vitals:  ? 07/30/21 0657 07/30/21 0750  ?BP: (!) 135/93 107/76  ?Pulse: 72 75  ?Resp: 19 20  ?Temp: 36.8 ?C (!) 36.4 ?C  ?SpO2: 99% 97%  ?  ?Last Pain:  ?Vitals:  ? 07/30/21 0750  ?TempSrc: Oral  ?PainSc: 0-No pain  ? ? ?  ?  ?  ?  ?  ?  ? ?Windell Norfolk ? ? ? ? ?

## 2021-07-31 LAB — SURGICAL PATHOLOGY

## 2021-07-31 LAB — H. PYLORI ANTIBODY, IGG: H Pylori IgG: 0.25 Index Value (ref 0.00–0.79)

## 2021-08-01 ENCOUNTER — Other Ambulatory Visit (INDEPENDENT_AMBULATORY_CARE_PROVIDER_SITE_OTHER): Payer: Self-pay

## 2021-08-01 DIAGNOSIS — R1013 Epigastric pain: Secondary | ICD-10-CM

## 2021-08-01 DIAGNOSIS — R110 Nausea: Secondary | ICD-10-CM

## 2021-08-01 DIAGNOSIS — R112 Nausea with vomiting, unspecified: Secondary | ICD-10-CM

## 2021-08-04 ENCOUNTER — Encounter (HOSPITAL_COMMUNITY): Payer: Self-pay | Admitting: Gastroenterology

## 2021-08-06 ENCOUNTER — Ambulatory Visit (HOSPITAL_BASED_OUTPATIENT_CLINIC_OR_DEPARTMENT_OTHER): Payer: BC Managed Care – PPO

## 2021-08-14 ENCOUNTER — Encounter (INDEPENDENT_AMBULATORY_CARE_PROVIDER_SITE_OTHER): Payer: Self-pay | Admitting: *Deleted

## 2021-09-04 ENCOUNTER — Ambulatory Visit (HOSPITAL_COMMUNITY): Payer: BC Managed Care – PPO

## 2021-09-11 DIAGNOSIS — M5416 Radiculopathy, lumbar region: Secondary | ICD-10-CM | POA: Diagnosis not present

## 2021-09-11 DIAGNOSIS — M47812 Spondylosis without myelopathy or radiculopathy, cervical region: Secondary | ICD-10-CM | POA: Diagnosis not present

## 2021-10-07 DIAGNOSIS — M5416 Radiculopathy, lumbar region: Secondary | ICD-10-CM | POA: Diagnosis not present

## 2021-11-10 DIAGNOSIS — J029 Acute pharyngitis, unspecified: Secondary | ICD-10-CM | POA: Diagnosis not present

## 2021-11-10 DIAGNOSIS — Z6824 Body mass index (BMI) 24.0-24.9, adult: Secondary | ICD-10-CM | POA: Diagnosis not present

## 2021-11-10 DIAGNOSIS — Z299 Encounter for prophylactic measures, unspecified: Secondary | ICD-10-CM | POA: Diagnosis not present

## 2021-12-17 DIAGNOSIS — M47812 Spondylosis without myelopathy or radiculopathy, cervical region: Secondary | ICD-10-CM | POA: Diagnosis not present

## 2021-12-17 DIAGNOSIS — M5416 Radiculopathy, lumbar region: Secondary | ICD-10-CM | POA: Diagnosis not present

## 2022-01-06 DIAGNOSIS — M47816 Spondylosis without myelopathy or radiculopathy, lumbar region: Secondary | ICD-10-CM | POA: Diagnosis not present

## 2022-01-22 ENCOUNTER — Encounter (INDEPENDENT_AMBULATORY_CARE_PROVIDER_SITE_OTHER): Payer: Self-pay | Admitting: Gastroenterology

## 2022-01-22 ENCOUNTER — Ambulatory Visit (INDEPENDENT_AMBULATORY_CARE_PROVIDER_SITE_OTHER): Payer: BC Managed Care – PPO | Admitting: Gastroenterology

## 2022-01-22 VITALS — BP 162/101 | HR 116 | Temp 98.1°F | Ht 65.5 in | Wt 159.2 lb

## 2022-01-22 DIAGNOSIS — R112 Nausea with vomiting, unspecified: Secondary | ICD-10-CM | POA: Diagnosis not present

## 2022-01-22 DIAGNOSIS — R1013 Epigastric pain: Secondary | ICD-10-CM | POA: Diagnosis not present

## 2022-01-22 MED ORDER — DICYCLOMINE HCL 10 MG PO CAPS: 10.0000 mg | ORAL_CAPSULE | Freq: Three times a day (TID) | ORAL | 1 refills | Status: DC | PRN

## 2022-01-22 MED ORDER — OMEPRAZOLE 40 MG PO CPDR: 40.0000 mg | DELAYED_RELEASE_CAPSULE | Freq: Every day | ORAL | 1 refills | Status: DC

## 2022-01-22 NOTE — Progress Notes (Signed)
Referring Provider: Glenda Chroman, MD Primary Care Physician:  Glenda Chroman, MD Primary GI Physician: Jenetta Downer  Chief Complaint  Patient presents with   Emesis    Follow up on nausea, vomiting and bloating. Wanting to go ahead and schedule CT scan that was recommended. Stools are soft and has diarrhea. Does not take Pantoprazole. Reports it did not help.    HPI:   Kathryn Berry is a 56 y.o. female with past medical history of  anxiety, depression, osteoporosis.  Patient presenting today for follow up of nausea, vomiting and bloating.  History: Last seen 07/21/21, at that time having nausea, epigastric discomfort and belching x8 months, taking probiotic which helped some. No heartburn or acid regurgitation. Symptoms worse at night. Taking protonix without improvement.  Recommend patient continue PPI BID, scheduled for EGD, as outlined below. Patient recommended to have CT A/P with contrast thereafter for further evaluation however she did not wish to pursue this  Present:  Patient reports that she has ongoing nausea and epigastric pain, almost daily. Has vomiting on occasion. She notes that she does not eat much because she feels bloated though eating actually improves her nausea and epigastric pain. States she has not had any weight loss. She stopped taking protonix as this was not helping her symptoms. She has occasional mild heartburn but nothing severe. She is having soft to watery stools, she may go 2-3 days without a BM then will be in and out of the bathroom throughout the next day. Denies rectal bleeding or melena. States that diarrhea is not new and has been ongoing for some time. She notes that she has lower abdominal pain that improves with a BM. States previously on chronic opiate pain medications, she has not been on any in about 1 year and wonders if long term use of those affected her GI tract.  She has had issues with elevated BP recently, stopped her duloxetine due to  concerns this was causing some of her symptoms, though she has not had any improvement. Tells me she is now raising her grand children and this has caused a lot more stress for her, she also has upcoming back surgery due to chronic back pain. Has not seen PCP regarding hypertension or her anxiety/depression.  Last Colonoscopy:04/04/20 [facility], unable to review records in EMR Last Endoscopy:-07/30/21- 2 cm hiatal hernia. - Normal stomach. Biopsied-negative - Normal examined duodenum. Biopsied-negative  Recommendations:    Past Medical History:  Diagnosis Date   Anxiety    Depression    Osteoporosis     Past Surgical History:  Procedure Laterality Date   BACK SURGERY     x 3   BIOPSY  07/30/2021   Procedure: BIOPSY;  Surgeon: Montez Morita, Quillian Quince, MD;  Location: [facility];  Service: Gastroenterology;;   ENDOMETRIAL ABLATION     ESOPHAGOGASTRODUODENOSCOPY (EGD) WITH PROPOFOL N/A 07/30/2021   Procedure: ESOPHAGOGASTRODUODENOSCOPY (EGD) WITH PROPOFOL;  Surgeon: Harvel Quale, MD;  Location: [facility];  Service: Gastroenterology;  Laterality: N/A;  730   GALLBLADDER SURGERY     HYSTEROTOMY     KNEE SURGERY Left    NECK SURGERY     x 3    Current Outpatient Medications  Medication Sig Dispense Refill   pregabalin (LYRICA) 50 MG capsule Take 50 mg by mouth 3 (three) times daily as needed (pain.).     pantoprazole (PROTONIX) 40 MG tablet Take 40 mg by mouth 2 (two) times daily. (Patient not taking: Reported  on 01/22/2022)     Probiotic Product (PROBIOTIC DAILY PO) Take 1 capsule by mouth daily. (Patient not taking: Reported on 01/22/2022)     No current facility-administered medications for this visit.    Allergies as of 01/22/2022   (No Known Allergies)    Family History  Problem Relation Age of Onset   Cancer Paternal Grandfather    Heart attack Maternal Grandmother    Heart attack Maternal Grandfather    Other Father        back problems   Alzheimer's  disease Mother     Social History   Socioeconomic History   Marital status: Married    Spouse name: Not on file   Number of children: Not on file   Years of education: Not on file   Highest education level: Not on file  Occupational History   Not on file  Tobacco Use   Smoking status: Former    Years: 30.00    Types: Cigarettes    Quit date: 06/2021    Years since quitting: 0.6    Passive exposure: Past   Smokeless tobacco: Never  Substance and Sexual Activity   Alcohol use: Not Currently   Drug use: Not Currently    Types: Marijuana   Sexual activity: Not Currently    Birth control/protection: Surgical    Comment: ablation  Other Topics Concern   Not on file  Social History Narrative   Not on file   Social Determinants of Health   Financial Resource Strain: Not on file  Food Insecurity: Not on file  Transportation Needs: Not on file  Physical Activity: Not on file  Stress: Not on file  Social Connections: Not on file    Review of systems General: negative for malaise, night sweats, fever, chills, weight loss Neck: Negative for lumps, goiter, pain and significant neck swelling Resp: Negative for cough, wheezing, dyspnea at rest CV: Negative for chest pain, leg swelling, palpitations, orthopnea GI: denies melena, hematochezia, constipation, dysphagia, odyonophagia, early satiety or unintentional weight loss. +diarrhea +nausea +vomiting  MSK: Negative for joint pain or swelling,and muscle pain.+chronic back pain Derm: Negative for itching or rash Psych: Denies depression, memory loss, confusion. No homicidal or suicidal ideation. +anxiety Heme: Negative for prolonged bleeding, bruising easily, and swollen nodes. Endocrine: Negative for cold or heat intolerance, polyuria, polydipsia and goiter. Neuro: negative for tremor, gait imbalance, syncope and seizures. The remainder of the review of systems is noncontributory.  Physical Exam: BP (!) 162/101 (BP Location:  Right Arm, Patient Position: Sitting, Cuff Size: Normal)   Pulse (!) 116   Temp 98.1 F (36.7 C) (Oral)   Ht 5' 5.5" (1.664 m)   Wt 159 lb 3.2 oz (72.2 kg)   BMI 26.09 kg/m  General:   Alert and oriented. No distress noted. Pleasant and cooperative.  Head:  Normocephalic and atraumatic. Eyes:  Conjuctiva clear without scleral icterus. Mouth:  Oral mucosa pink and moist. Good dentition. No lesions. Heart: Normal rate and rhythm, s1 and s2 heart sounds present.  Lungs: Clear lung sounds in all lobes. Respirations equal and unlabored. Abdomen:  +BS, soft, non-tender and non-distended. No rebound or guarding. No HSM or masses noted. Derm: No palmar erythema or jaundice Msk:  Symmetrical without gross deformities. Normal posture. Extremities:  Without edema. Neurologic:  Alert and  oriented x4 Psych:  Alert and cooperative.+anxious +tearful  Invalid input(s): "6 MONTHS"   ASSESSMENT: Kathryn Berry is a 56 y.o. female presenting today for follow up  of nausea, vomiting and epigastric pain.  Nausea, vomiting and epigastric pain continued, EGD in march was unremarkable for etiology of symptoms, it was recommended at that time she have CT A/P for further evaluation which she did not wish to do, however, she is amenable to proceed with further evaluation at this time. Query if some of her UGI symptoms could still be related to GERD, protonix provided no relief previously, will trial omeprazole 40mg  once daily, if she has no improvement with this after 2 weeks I advised her to stop it.   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 She has no rectal bleeding, melena or weight loss. Will start bentyl 10mg  TID PRN.  She is very anxious and tearful during her visit today, BP is elevated, no hx of HTN, she has recent custody of  her grandchildren which has put a lot more stress on her. I encouraged her to see her PCP regarding BP and anxiety/depression, she recently stopped duloxetine to see if it was contributing to her GI symptoms but did not notice any improvement, I encouraged her to restart this in the meantime until she can be evaluated further by her PCP/possibly psych. Patient denies any SI today.    PLAN:  Schedule CT A/P with contrast  2. Rx bentyl 10mg  TID PRN  3. Rx Omeprazole 40mg  daily-goodrx card given 4. See PCP about BP and anti anxiety meds  All questions were answered, patient verbalized understanding and is in agreement with plan as outlined above.    Follow Up: 3 months   Duel Conrad L. , MSN, APRN, AGNP-C Adult-Gerontology Nurse Practitioner [facility]

## 2022-01-22 NOTE — Patient Instructions (Signed)
-  I Am sending dicyclomine 10mg  for you to take up to three times per day for abdominal pain, you can also try taking this on more scheduled dosing 30-45 minutes prior to eating -I have also sent omeprazole 40mg , this is an acid reflux medication, if you do not notice any improvement in symptoms after 2 weeks you can stop it, I have provided a goodrx card to use so this is affordable -Please talk to your doctor about your Blood pressure as it is significantly elevated, it may also be a good idea to discuss other anxiety/depression medications with them or the possibility of seeing a psychiatrist for further management, in the meantime you may want to start back on your duloxetine  Follow up 3 months

## 2022-02-05 DIAGNOSIS — Z1231 Encounter for screening mammogram for malignant neoplasm of breast: Secondary | ICD-10-CM | POA: Diagnosis not present

## 2022-02-09 ENCOUNTER — Ambulatory Visit: Payer: BC Managed Care – PPO | Admitting: Surgical

## 2022-02-16 ENCOUNTER — Ambulatory Visit (INDEPENDENT_AMBULATORY_CARE_PROVIDER_SITE_OTHER): Payer: Self-pay | Admitting: Plastic Surgery

## 2022-02-16 ENCOUNTER — Encounter: Payer: Self-pay | Admitting: Plastic Surgery

## 2022-02-16 DIAGNOSIS — Z719 Counseling, unspecified: Secondary | ICD-10-CM

## 2022-02-16 NOTE — Progress Notes (Signed)
Botulinum Toxin and Filler Injection Procedure Note  Procedure: Cosmetic botulinum toxin and Filler administration  Pre-operative Diagnosis: Dynamic rhytides and midface volume loss  Post-operative Diagnosis: Same  Complications:  None  Brief history: The patient desires botulinum toxin injection of her forehead. I discussed with the patient this proposed procedure of botulinum toxin injections, which is customized depending on the particular needs of the patient. It is performed on facial rhytids as a temporary correction. The alternatives were discussed with the patient. The risks were addressed including bleeding, scarring, infection, damage to deeper structures, asymmetry, and chronic pain, which may occur infrequently after a procedure. The individual's choice to undergo a surgical procedure is based on the comparison of risks to potential benefits. Other risks include unsatisfactory results, brow ptosis, eyelid ptosis, allergic reaction, temporary paralysis, which should go away with time, bruising, blurring disturbances and delayed healing. Botulinum toxin injections do not arrest the aging process or produce permanent tightening of the eyelid.  Operative intervention maybe necessary to maintain the results of a blepharoplasty or botulinum toxin. The patient understands and wishes to proceed.  Procedure: The area was prepped with alcohol and dried with a clean gauze. Using a clean technique, the botulinum toxin was diluted with 1.25 cc of preservative-free normal saline which was slowly injected with an 18 gauge needle in a tuberculin syringes.  A 32 gauge needles were then used to inject the botulinum toxin. This mixture allow for an aliquot of 4 units per 0.1 cc in each injection site.    Subsequently the mixture was injected in the glabellar and forehead area with preservation of the temporal branch to the lateral eyebrow as well as into each lateral canthal area beginning from the lateral  orbital rim medial to the zygomaticus major in 3 separate areas. A total of 32 Units of botulinum toxin was used. The forehead and glabellar area was injected with care to inject intramuscular only while holding pressure on the supratrochlear vessels in each area during each injection on either side of the medial corrugators. The injection proceeded vertically superiorly to the medial 2/3 of the frontalis muscle and superior 2/3 of the lateral frontalis, again with preservation of the frontal branch.  The midface area was injected at the 3 sub-regions of the mid-face: zygomaticomalar region, anteromedial cheek region, and submalar region for a total of one syringe on each side of the face. The technique used was serial puncture with equal injections in the 3 sub-regions: the zygomaticomalar region, the anteromedial cheek, and the submalar region.  No complications were noted. Light pressure was held for 5 minutes. She was instructed explicitly in post-operative care.  Botox LOT:  [id number]  EXP:  2025/11  Restylane x 2 LOT: [id number] EXP: 2022-08-02

## 2022-02-25 ENCOUNTER — Telehealth: Payer: Self-pay | Admitting: Physician Assistant

## 2022-02-25 ENCOUNTER — Telehealth (INDEPENDENT_AMBULATORY_CARE_PROVIDER_SITE_OTHER): Payer: Self-pay | Admitting: *Deleted

## 2022-02-25 ENCOUNTER — Other Ambulatory Visit (INDEPENDENT_AMBULATORY_CARE_PROVIDER_SITE_OTHER): Payer: Self-pay | Admitting: Gastroenterology

## 2022-02-25 MED ORDER — ONDANSETRON HCL 4 MG PO TABS: 4.0000 mg | ORAL_TABLET | Freq: Three times a day (TID) | ORAL | 0 refills | Status: DC | PRN

## 2022-02-25 NOTE — Telephone Encounter (Signed)
Patient thinks omeprazole is helping more than dicyclomine.

## 2022-02-25 NOTE — Telephone Encounter (Signed)
error 

## 2022-02-25 NOTE — Telephone Encounter (Signed)
Pt would like a call back about laser resurfacing that was discussed.

## 2022-02-25 NOTE — Telephone Encounter (Signed)
Was having vomiting 1 -2 times per day but last night she got worse and this morning worse with nausea and vomiting. Vomited 3 times this morning. Would like something called in for nausea.  [facility] [place]  Patient's number [phone]

## 2022-02-25 NOTE — Telephone Encounter (Signed)
Pt notiifed zofran sent in and she states she does think omeprazole is helping more so than the dicyclomine. Patient wanted to let you know her ct scan is on Saturday.

## 2022-02-25 NOTE — Telephone Encounter (Signed)
Seen 01/22/22

## 2022-02-25 NOTE — Telephone Encounter (Signed)
Patient left voicemail that she was sicker now and having vomiting and wanted to see if something could be called in for her.   Left number to call her back on [phone]  I tried to call to get more information and no answer. Not able to leave a message.

## 2022-02-28 ENCOUNTER — Ambulatory Visit (HOSPITAL_COMMUNITY): Payer: BC Managed Care – PPO

## 2022-03-02 ENCOUNTER — Other Ambulatory Visit (INDEPENDENT_AMBULATORY_CARE_PROVIDER_SITE_OTHER): Payer: Self-pay

## 2022-03-02 DIAGNOSIS — R1013 Epigastric pain: Secondary | ICD-10-CM

## 2022-03-02 DIAGNOSIS — R112 Nausea with vomiting, unspecified: Secondary | ICD-10-CM

## 2022-03-02 NOTE — Telephone Encounter (Signed)
I was able to speak with her. Can you call and schedule her for a visit to have Halo done with Dr. Keturah Barre.

## 2022-03-03 ENCOUNTER — Telehealth: Payer: Self-pay | Admitting: Plastic Surgery

## 2022-03-03 NOTE — Telephone Encounter (Signed)
Attempted to call pt to give pricing for Halo procedure but no answer.

## 2022-03-06 ENCOUNTER — Ambulatory Visit (HOSPITAL_COMMUNITY): Payer: BC Managed Care – PPO

## 2022-03-09 ENCOUNTER — Institutional Professional Consult (permissible substitution): Payer: BC Managed Care – PPO | Admitting: Plastic Surgery

## 2022-03-11 ENCOUNTER — Ambulatory Visit
Admission: RE | Admit: 2022-03-11 | Discharge: 2022-03-11 | Disposition: A | Discharge status: Home / Self Care | Payer: BC Managed Care – PPO | Source: Ambulatory Visit | Attending: Gastroenterology | Admitting: Gastroenterology

## 2022-03-11 DIAGNOSIS — E278 Other specified disorders of adrenal gland: Secondary | ICD-10-CM | POA: Diagnosis not present

## 2022-03-11 DIAGNOSIS — R1013 Epigastric pain: Secondary | ICD-10-CM

## 2022-03-11 DIAGNOSIS — G8929 Other chronic pain: Secondary | ICD-10-CM | POA: Diagnosis not present

## 2022-03-11 DIAGNOSIS — R112 Nausea with vomiting, unspecified: Secondary | ICD-10-CM | POA: Diagnosis not present

## 2022-03-11 DIAGNOSIS — R1030 Lower abdominal pain, unspecified: Secondary | ICD-10-CM | POA: Diagnosis not present

## 2022-03-11 MED ORDER — IOPAMIDOL (ISOVUE-300) INJECTION 61%
100.0000 mL | Freq: Once | INTRAVENOUS | Status: AC | PRN
  Administered 2022-03-11: 100 mL via INTRAVENOUS

## 2022-03-17 ENCOUNTER — Other Ambulatory Visit (INDEPENDENT_AMBULATORY_CARE_PROVIDER_SITE_OTHER): Payer: Self-pay | Admitting: Gastroenterology

## 2022-03-23 DIAGNOSIS — M47816 Spondylosis without myelopathy or radiculopathy, lumbar region: Secondary | ICD-10-CM | POA: Diagnosis not present

## 2022-03-24 ENCOUNTER — Telehealth: Payer: Self-pay | Admitting: Plastic Surgery

## 2022-03-24 NOTE — Telephone Encounter (Signed)
If we havent gotten her on the schedule can we try calling her again?

## 2022-03-24 NOTE — Telephone Encounter (Signed)
Called pt to set up Halo laser appt but she has declined to due the laser and plans on moving forward with a face lift but will call us back to schedule that appt.  She has several surgical procedures with her back that is transpiring first.  She will give [place] a call to get that schedule once she is ready.

## 2022-04-13 DIAGNOSIS — M47816 Spondylosis without myelopathy or radiculopathy, lumbar region: Secondary | ICD-10-CM | POA: Diagnosis not present

## 2022-04-20 ENCOUNTER — Ambulatory Visit (INDEPENDENT_AMBULATORY_CARE_PROVIDER_SITE_OTHER): Payer: BC Managed Care – PPO | Admitting: Gastroenterology

## 2022-04-20 ENCOUNTER — Encounter (INDEPENDENT_AMBULATORY_CARE_PROVIDER_SITE_OTHER): Payer: Self-pay | Admitting: Gastroenterology

## 2022-04-20 ENCOUNTER — Telehealth (INDEPENDENT_AMBULATORY_CARE_PROVIDER_SITE_OTHER): Payer: Self-pay | Admitting: *Deleted

## 2022-04-20 VITALS — BP 99/70 | HR 118 | Temp 98.8°F | Ht 65.5 in | Wt 169.2 lb

## 2022-04-20 DIAGNOSIS — R5382 Chronic fatigue, unspecified: Secondary | ICD-10-CM

## 2022-04-20 DIAGNOSIS — R110 Nausea: Secondary | ICD-10-CM | POA: Diagnosis not present

## 2022-04-20 DIAGNOSIS — R195 Other fecal abnormalities: Secondary | ICD-10-CM | POA: Diagnosis not present

## 2022-04-20 MED ORDER — ONDANSETRON HCL 4 MG PO TABS: 4.0000 mg | ORAL_TABLET | Freq: Three times a day (TID) | ORAL | 1 refills | Status: DC | PRN

## 2022-04-20 NOTE — Progress Notes (Signed)
Referring Provider: Ignatius Specking, MD Primary Care Physician:  Ignatius Specking, MD Primary GI Physician: Levon Hedger   Chief Complaint  Patient presents with   Nausea    3 month follow up on nausea. Still about the same as last visit. Takes zofran as needed. Takes omeprazole daily.    HPI:   Kathryn Berry is a 56 y.o. female with past medical history of anxiety, depression, osteoporosis, borderline DM   Patient presenting today for follow up of nausea, loose stools   Last seen September 2023, at that time ongoing nausea and epigastric pain, almost daily. vomiting on occasion. She notes that she does not eat much because she feels bloated though eating actually improves her nausea and epigastric pain. stopped taking protonix as this was not helping her symptoms. She has occasional mild heartburn but nothing severe. soft to watery stools, she may go 2-3 days without a BM.  has lower abdominal pain that improves with a BM. States previously on chronic opiate pain medications, she has not been on any in about 1 year.  now raising her grand children and this has caused a lot more stress for her, she also has upcoming back surgery due to chronic back pain. Has not seen PCP regarding hypertension or her anxiety/depression.  Recommend CT A/P with contrast, Rx Bentyl 10mg  TID PRN, omeprazole 40mg  daily, see pcp about BP and anxiety. CT was normal.  Present:  States that she continues to have nausea almost daily. She has very rare vomiting. Some lower abdominal pain prior to defecation which improves thereafter, seems to be improved some with use of bentyl as well, taking this BID and feels she is having it less often. She continues to have loose stools, with 4-5 BMs per day. Stools can range from watery to mostly loose. Rare solid stools unless she takes pepto bismol. She has gained 20 pounds, appetite is pretty good. She feels that eating actually sometimes improves her nausea though she often feels that  food sits in her stomach for a long period. Previously told she was borderline diabetic.  Anxiety is improved on her anxiolytics.  She did have an ablation on her back and states this has helped a lot with her pain.   Last colonoscopy: 04/04/20 [facility], unable to review records in EMR Last Endoscopy:-07/30/21- 2 cm hiatal hernia. - Normal stomach. Biopsied-negative - Normal examined duodenum. Biopsied-negative   Past Medical History:  Diagnosis Date   Anxiety    Depression    Osteoporosis     Past Surgical History:  Procedure Laterality Date   BACK SURGERY     x 3   BIOPSY  07/30/2021   Procedure: BIOPSY;  Surgeon: 08/01/21, 08/01/2021, MD;  Location: [facility];  Service: Gastroenterology;;   ENDOMETRIAL ABLATION     ESOPHAGOGASTRODUODENOSCOPY (EGD) WITH PROPOFOL N/A 07/30/2021   Procedure: ESOPHAGOGASTRODUODENOSCOPY (EGD) WITH PROPOFOL;  Surgeon: Reuel Boom, MD;  Location: [facility];  Service: Gastroenterology;  Laterality: N/A;  730   GALLBLADDER SURGERY     HYSTEROTOMY     KNEE SURGERY Left    NECK SURGERY     x 3    Current Outpatient Medications  Medication Sig Dispense Refill   dicyclomine (BENTYL) 10 MG capsule TAKE 1 CAPSULE BY MOUTH THREE TIMES DAILY AS NEEDED FOR SPASMS 90 capsule 1   DULoxetine (CYMBALTA) 30 MG capsule Take 30 mg by mouth daily.     meloxicam (MOBIC) 15 MG tablet Take 15  mg by mouth daily.     methocarbamol (ROBAXIN) 500 MG tablet Take 500 mg by mouth 3 (three) times daily.     omeprazole (PRILOSEC) 40 MG capsule Take 1 capsule by mouth once daily 90 capsule 1   ondansetron (ZOFRAN) 4 MG tablet Take 1 tablet (4 mg total) by mouth every 8 (eight) hours as needed for nausea or vomiting. 30 tablet 0   pregabalin (LYRICA) 75 MG capsule Take 75 mg by mouth 3 (three) times daily.     Rosuvastatin Calcium (CRESTOR PO) Take by mouth.     No current facility-administered medications for this visit.    Allergies as of  04/20/2022   (No Known Allergies)    Family History  Problem Relation Age of Onset   Cancer Paternal Grandfather    Heart attack Maternal Grandmother    Heart attack Maternal Grandfather    Other Father        back problems   Alzheimer's disease Mother     Social History   Socioeconomic History   Marital status: Married    Spouse name: Not on file   Number of children: Not on file   Years of education: Not on file   Highest education level: Not on file  Occupational History   Not on file  Tobacco Use   Smoking status: Former    Years: 30.00    Types: Cigarettes    Quit date: 06/2021    Years since quitting: 0.8    Passive exposure: Past   Smokeless tobacco: Never  Substance and Sexual Activity   Alcohol use: Not Currently   Drug use: Not Currently    Types: Marijuana   Sexual activity: Not Currently    Birth control/protection: Surgical    Comment: ablation  Other Topics Concern   Not on file  Social History Narrative   Not on file   Social Determinants of Health   Financial Resource Strain: Not on file  Food Insecurity: Not on file  Transportation Needs: Not on file  Physical Activity: Not on file  Stress: Not on file  Social Connections: Not on file    Review of systems General: negative for malaise, night sweats, fever, chills, weight loss Neck: Negative for lumps, goiter, pain and significant neck swelling Resp: Negative for cough, wheezing, dyspnea at rest CV: Negative for chest pain, leg swelling, palpitations, orthopnea GI: denies melena, hematochezia, vomiting, constipation, dysphagia, odyonophagia, early satiety or unintentional weight loss. +nausea +loose stools  MSK: Negative for joint pain or swelling, back pain, and muscle pain. Derm: Negative for itching or rash Psych: Denies depression, anxiety, memory loss, confusion. No homicidal or suicidal ideation.  Heme: Negative for prolonged bleeding, bruising easily, and swollen nodes. Endocrine:  Negative for cold or heat intolerance, polyuria, polydipsia and goiter. Neuro: negative for tremor, gait imbalance, syncope and seizures. The remainder of the review of systems is noncontributory.  Physical Exam: BP 99/70 (BP Location: Left Arm, Patient Position: Sitting, Cuff Size: Large)   Pulse (!) 118   Temp 98.8 F (37.1 C) (Oral)   Ht 5' 5.5" (1.664 m)   Wt 169 lb 3.2 oz (76.7 kg)   BMI 27.73 kg/m  General:   Alert and oriented. No distress noted. Pleasant and cooperative.  Head:  Normocephalic and atraumatic. Eyes:  Conjuctiva clear without scleral icterus. Mouth:  Oral mucosa pink and moist. Good dentition. No lesions. Heart: Normal rate and rhythm, s1 and s2 heart sounds present.  Lungs: Clear lung sounds  in all lobes. Respirations equal and unlabored. Abdomen:  +BS, soft, non-tender and non-distended. No rebound or guarding. No HSM or masses noted. Derm: No palmar erythema or jaundice Msk:  Symmetrical without gross deformities. Normal posture. Extremities:  Without edema. Neurologic:  Alert and  oriented x4 Psych:  Alert and cooperative. Normal mood and affect.  Invalid input(s): "6 MONTHS"   ASSESSMENT: Kathryn Berry is a 56 y.o. female presenting today for follow up of Nausea and loose stools.  Nausea: ongoing, recent EGD and CT A/P without findings to suggest cause for her symptoms. She deneis any GERD symptoms, is taking zofran PRN with some results. Recommend proceeding with GES for further evaluation as I cannot rule out component of gastroparesis given ongoing nausea.   Loose stools: has been ongoing for some time. No rectal bleeding or melena. Lower abdominal pain sometimes improved with defecation, lessened in frequency with use of Bentyl. Likely a component of IBS-D, potentially some chronic bowel changes due to previous opiate use. Will check TSH to rule out underlying thyroid etiology. Last Colonoscopy in 2021 at [facility], normal per patient. No weight loss.  Recommend starting 1T benefiber BID with meals to help with stool consistency.    PLAN:  Check TSH  2. GES  3. Continue omeprazole 40mg  daily 4. Continue zofran PRN for nausea 5. Benefiber 1T BID with meals, good water intake 6. Continue bentyl 10mg  TID PRN  All questions were answered, patient verbalized understanding and is in agreement with plan as outlined above.    Follow Up: 3 months   Yaden Seith L. Alver Sorrow, MSN, APRN, AGNP-C Adult-Gerontology Nurse Practitioner [facility]  I have reviewed the note and agree with the APP's assessment as described in this progress note  Chronic nausea may have a central component. If negative workup as described above could consider CT brain.  Maylon Peppers, MD Gastroenterology and Hepatology [facility]astroenter[facility]

## 2022-04-20 NOTE — Patient Instructions (Addendum)
We will get you scheduled for GES to evaluate your nausea (hold dicyclomine atleast 2 days prior to exam, if you are on any opiate pain medications, you also will need to hold these) Continue to use zofran as needed for nausea I would like to check thyroid function to rule this out as a cause of your looser stools, you can continue dicyclomine 2-3x/day for lower abdominal discomfort Please start benefiber 1T twice daily with a meal, make sure water intake is good. This can help to thicken up your stools   Follow up 3 months

## 2022-04-20 NOTE — Telephone Encounter (Signed)
PA approved via carelon. Order ID: [id number]       Authorized  Approval Valid Through: 04/20/2022 - 05/19/2022

## 2022-04-21 DIAGNOSIS — Z87891 Personal history of nicotine dependence: Secondary | ICD-10-CM | POA: Diagnosis not present

## 2022-04-21 DIAGNOSIS — R5383 Other fatigue: Secondary | ICD-10-CM | POA: Diagnosis not present

## 2022-04-21 DIAGNOSIS — Z0000 Encounter for general adult medical examination without abnormal findings: Secondary | ICD-10-CM | POA: Diagnosis not present

## 2022-04-21 DIAGNOSIS — Z6826 Body mass index (BMI) 26.0-26.9, adult: Secondary | ICD-10-CM | POA: Diagnosis not present

## 2022-04-21 DIAGNOSIS — F319 Bipolar disorder, unspecified: Secondary | ICD-10-CM | POA: Diagnosis not present

## 2022-04-21 DIAGNOSIS — Z1331 Encounter for screening for depression: Secondary | ICD-10-CM | POA: Diagnosis not present

## 2022-04-21 DIAGNOSIS — Z299 Encounter for prophylactic measures, unspecified: Secondary | ICD-10-CM | POA: Diagnosis not present

## 2022-04-21 DIAGNOSIS — E7800 Pure hypercholesterolemia, unspecified: Secondary | ICD-10-CM | POA: Diagnosis not present

## 2022-04-22 ENCOUNTER — Ambulatory Visit (HOSPITAL_COMMUNITY)
Admission: RE | Admit: 2022-04-22 | Discharge: 2022-04-22 | Disposition: A | Discharge status: Home / Self Care | Payer: BC Managed Care – PPO | Source: Ambulatory Visit | Attending: Gastroenterology | Admitting: Gastroenterology

## 2022-04-22 ENCOUNTER — Encounter (HOSPITAL_COMMUNITY): Payer: Self-pay

## 2022-04-22 DIAGNOSIS — R112 Nausea with vomiting, unspecified: Secondary | ICD-10-CM | POA: Diagnosis not present

## 2022-04-22 DIAGNOSIS — R6881 Early satiety: Secondary | ICD-10-CM | POA: Diagnosis not present

## 2022-04-22 DIAGNOSIS — R110 Nausea: Secondary | ICD-10-CM | POA: Insufficient documentation

## 2022-04-22 MED ORDER — TECHNETIUM TC 99M SULFUR COLLOID
2.0000 | Freq: Once | INTRAVENOUS | Status: AC | PRN
  Administered 2022-04-22: 2.2 via ORAL

## 2022-04-24 DIAGNOSIS — R5383 Other fatigue: Secondary | ICD-10-CM | POA: Diagnosis not present

## 2022-04-24 DIAGNOSIS — E7800 Pure hypercholesterolemia, unspecified: Secondary | ICD-10-CM | POA: Diagnosis not present

## 2022-04-24 DIAGNOSIS — Z79899 Other long term (current) drug therapy: Secondary | ICD-10-CM | POA: Diagnosis not present

## 2022-04-29 DIAGNOSIS — M47816 Spondylosis without myelopathy or radiculopathy, lumbar region: Secondary | ICD-10-CM | POA: Diagnosis not present

## 2022-05-07 DIAGNOSIS — E1169 Type 2 diabetes mellitus with other specified complication: Secondary | ICD-10-CM | POA: Diagnosis not present

## 2022-05-07 DIAGNOSIS — Z299 Encounter for prophylactic measures, unspecified: Secondary | ICD-10-CM | POA: Diagnosis not present

## 2022-05-07 DIAGNOSIS — F339 Major depressive disorder, recurrent, unspecified: Secondary | ICD-10-CM | POA: Diagnosis not present

## 2022-05-07 DIAGNOSIS — E7800 Pure hypercholesterolemia, unspecified: Secondary | ICD-10-CM | POA: Diagnosis not present

## 2022-05-07 DIAGNOSIS — E559 Vitamin D deficiency, unspecified: Secondary | ICD-10-CM | POA: Diagnosis not present

## 2022-05-28 DIAGNOSIS — M47816 Spondylosis without myelopathy or radiculopathy, lumbar region: Secondary | ICD-10-CM | POA: Diagnosis not present

## 2022-06-17 ENCOUNTER — Telehealth: Payer: Self-pay | Admitting: Physician Assistant

## 2022-06-17 NOTE — Telephone Encounter (Signed)
error 

## 2022-06-27 DIAGNOSIS — Z20822 Contact with and (suspected) exposure to covid-19: Secondary | ICD-10-CM | POA: Diagnosis not present

## 2022-06-27 DIAGNOSIS — R059 Cough, unspecified: Secondary | ICD-10-CM | POA: Diagnosis not present

## 2022-06-27 DIAGNOSIS — J020 Streptococcal pharyngitis: Secondary | ICD-10-CM | POA: Diagnosis not present

## 2022-06-27 DIAGNOSIS — R070 Pain in throat: Secondary | ICD-10-CM | POA: Diagnosis not present

## 2022-07-07 DIAGNOSIS — M47812 Spondylosis without myelopathy or radiculopathy, cervical region: Secondary | ICD-10-CM | POA: Diagnosis not present

## 2022-07-21 ENCOUNTER — Ambulatory Visit (INDEPENDENT_AMBULATORY_CARE_PROVIDER_SITE_OTHER): Payer: BC Managed Care – PPO | Admitting: Gastroenterology

## 2022-07-22 ENCOUNTER — Encounter (INDEPENDENT_AMBULATORY_CARE_PROVIDER_SITE_OTHER): Payer: Self-pay | Admitting: Gastroenterology

## 2022-07-29 DIAGNOSIS — L570 Actinic keratosis: Secondary | ICD-10-CM | POA: Diagnosis not present

## 2022-07-29 DIAGNOSIS — B079 Viral wart, unspecified: Secondary | ICD-10-CM | POA: Diagnosis not present

## 2022-09-25 ENCOUNTER — Institutional Professional Consult (permissible substitution): Payer: BC Managed Care – PPO | Admitting: Plastic Surgery

## 2022-09-29 ENCOUNTER — Other Ambulatory Visit (INDEPENDENT_AMBULATORY_CARE_PROVIDER_SITE_OTHER): Payer: Self-pay | Admitting: Gastroenterology

## 2022-10-02 ENCOUNTER — Institutional Professional Consult (permissible substitution): Payer: BC Managed Care – PPO | Admitting: Plastic Surgery

## 2022-10-28 IMAGING — CT CT CERVICAL SPINE W/ CM
2 series · 10 of 14 positions shown, 12 images · non-contrast
Comparison: Cervical myelogram 09/20/2019

CLINICAL DATA: Pain in the neck, thoracic spine, and right greater
than left arms. Prior cervical fusion.
TECHNIQUE: Contiguous axial images were obtained through the Cervical and
Thoracic spine after the intrathecal infusion of contrast. Coronal
and sagittal reconstructions were obtained of the axial image sets.

[Series 2: cspine soft (person_name) · axial · 0.33mm/px · z∈[-241,-125]mm · 5 of 88 slices shown]
[im 15/88  soft-tissue]
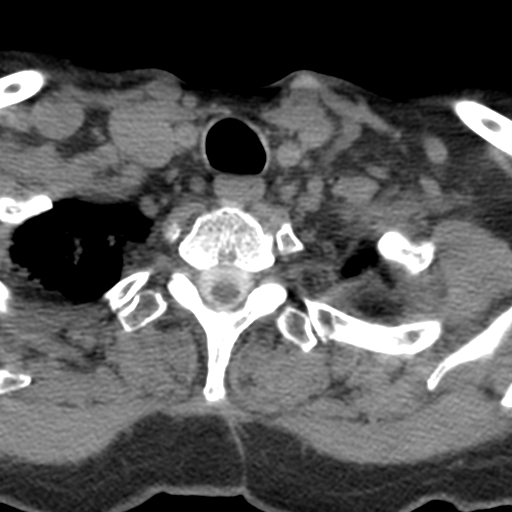
[im 30/88  soft-tissue]
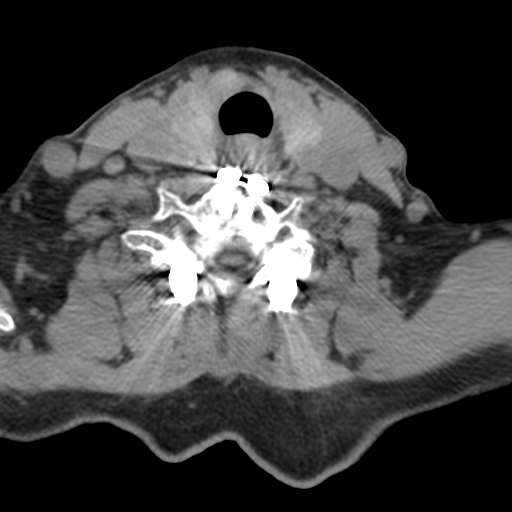
[im 44/88  soft-tissue]
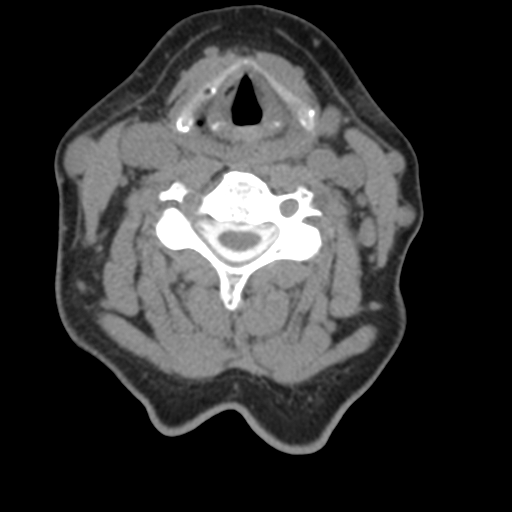
[im 59/88  soft-tissue]
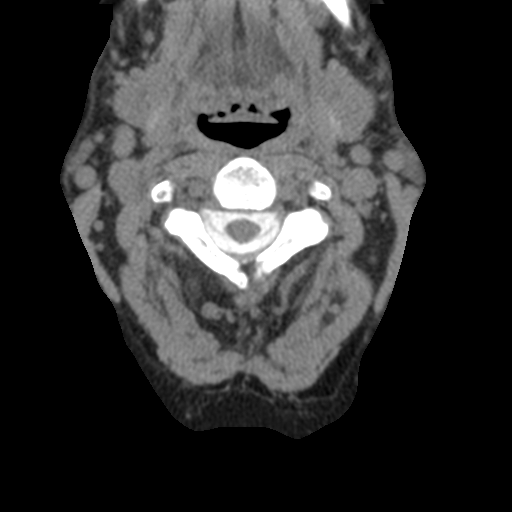
[im 73/88  soft-tissue]
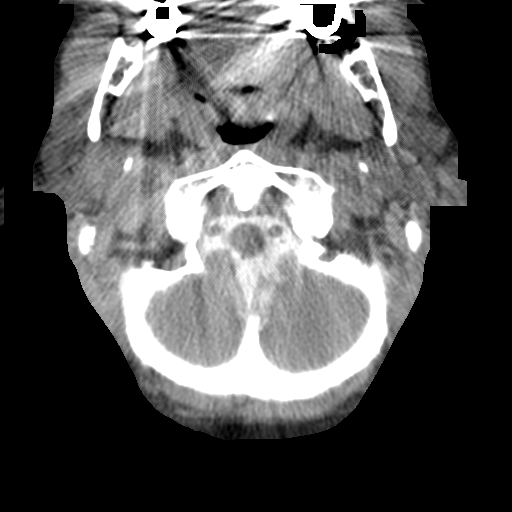

[Series 8: angled axial soft · axial · 0.29mm/px · z∈[-258,-141]mm · 5 of 90 slices shown, 7 images]
[im 15/90  soft-tissue]
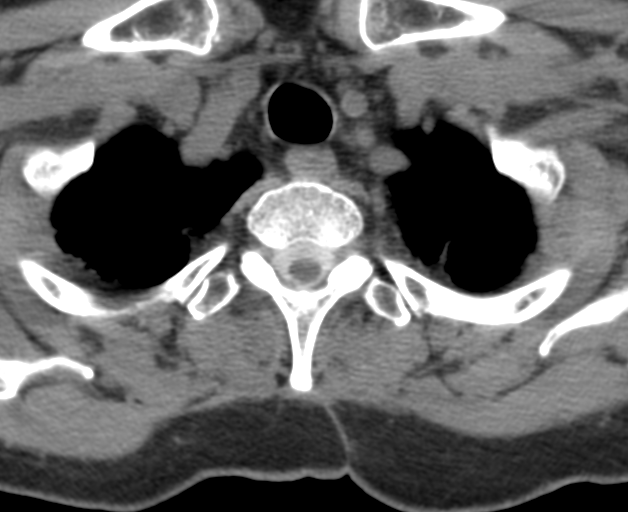
[im 15/90  bone]
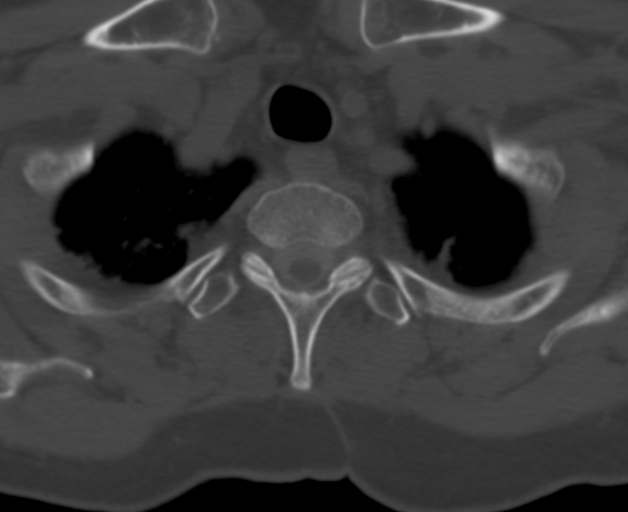
[im 30/90  bone]
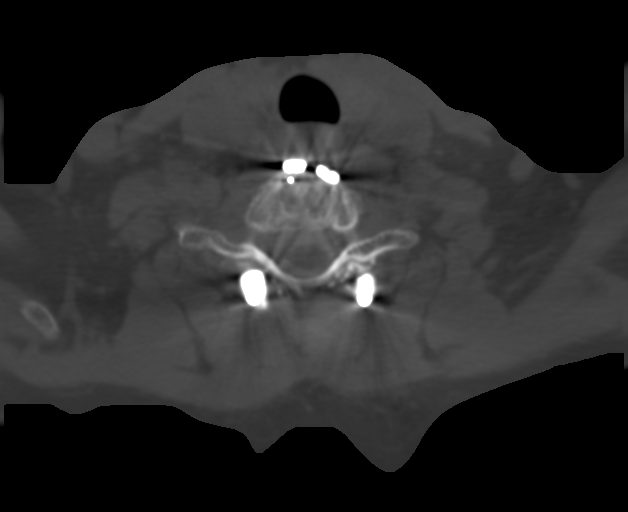
[im 45/90  bone]
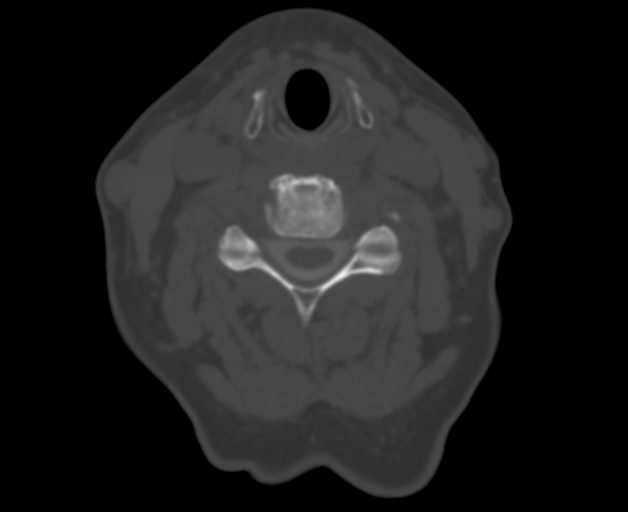
[im 60/90  bone]
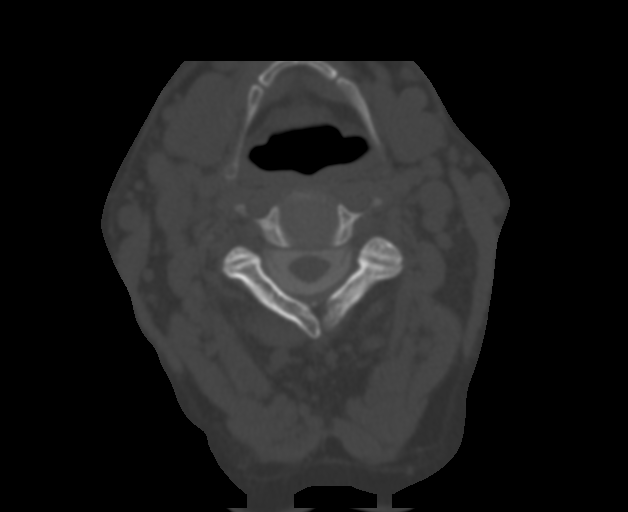
[im 75/90  soft-tissue]
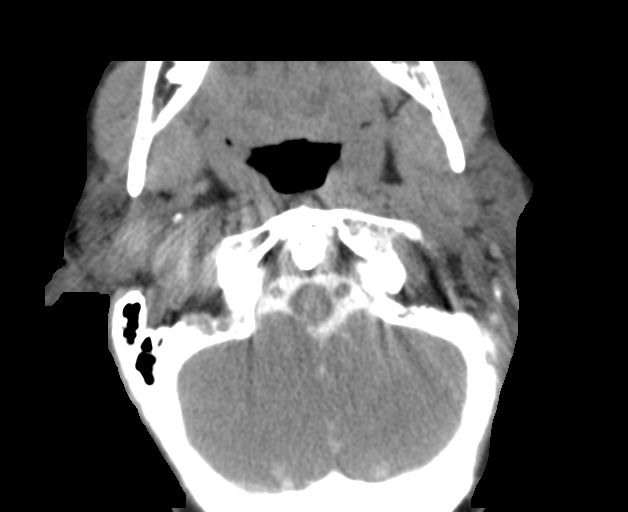
[im 75/90  bone]
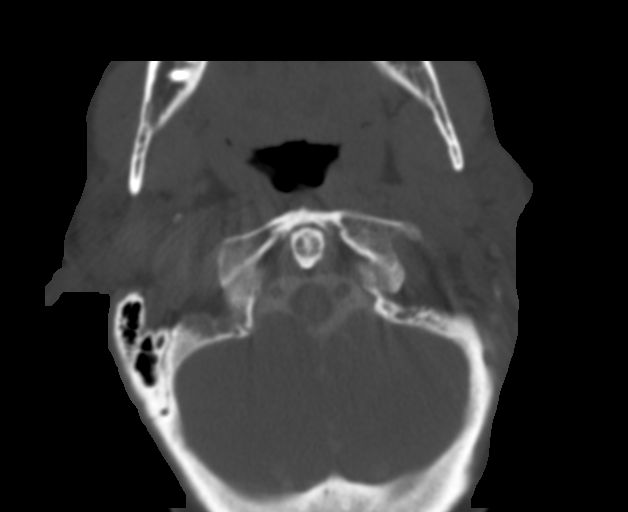

[10 of 14 positions shown; findings below may reference images not displayed]

FLUOROSCOPY TIME:  Fluoroscopy Time: 55 seconds

Radiation Exposure Index: 174.72 microGray*m^2

PROCEDURE:
LUMBAR PUNCTURE FOR CERVICAL AND THORACIC MYELOGRAM

After thorough discussion of risks and benefits of the procedure
including bleeding, infection, injury to nerves, blood vessels,
adjacent structures as well as headache and CSF leak, written and
oral informed consent was obtained. Consent was obtained by Dr.
Istekhar Muaz.

Patient was positioned prone on the fluoroscopy table. Local
anesthesia was provided with 1% lidocaine without epinephrine after
prepped and draped in the usual sterile fashion. Puncture was
performed at L3-4 using a 3 inch 22-gauge spinal needle via a left
interlaminar approach. Using a single pass through the dura, the
needle was placed within the thecal sac, with return of clear CSF.
10 mL of Isovue G-T33 was injected into the thecal sac, with normal
opacification of the nerve roots and cauda equina consistent with
free flow within the subarachnoid space. The patient was then moved
to the Trendelenburg position and contrast flowed into the Thoracic
spine region.

I personally performed the lumbar puncture and administered the
intrathecal contrast. I also personally supervised acquisition of
the myelogram images.
FINDINGS: CERVICAL AND THORACIC MYELOGRAM FINDINGS:

Sequelae of C5-C7 ACDF and C6-7 posterior fusion are identified. The
spinal canal appears widely patent at these levels. There is a
shallow ventral extradural defect at C4-5 without evidence of
significant stenosis. There are also shallow ventral extradural
defects in the midthoracic and upper lumbar spine without evidence
of significant stenosis.

CT CERVICAL MYELOGRAM FINDINGS:

There is chronic straightening of the normal cervical lordosis
without listhesis. No fracture or suspicious osseous lesion is
evident. Sequelae of C5-C7 ACDF are again identified with evidence
of solid arthrodesis at both levels. There has been interval
posterior fusion at C6-7 with a small amount of bridging bone across
the posterior elements. The C6 articular pillar screws extend into
the posterolateral aspects of the transverse foramina, more notably
on the right than the left. The cervical spinal cord is normal in
caliber. Mild pleuroparenchymal scarring is noted in the lung
apices.

C2-3: Negative.

C3-4: Minimal disc bulging without stenosis, unchanged.

C4-5: Minimal disc bulging without stenosis, unchanged.

C5-6: ACDF.  No stenosis.

C6-7: ACDF and posterior fusion. Asymmetrically advanced left facet
arthrosis and mild left uncovertebral spurring result in mild left
neural foraminal stenosis, mildly improved from the prior myelogram.
Widely patent spinal canal and right neural foramen.

C7-T1: Mild right and moderate left facet arthrosis without disc
herniation or stenosis, unchanged.

CT THORACIC MYELOGRAM FINDINGS:

There is slight left convex curvature of the upper thoracic spine
and slight right convex curvature of the midthoracic spine. There is
no listhesis. No fracture or suspicious osseous lesion is
identified. The thoracic spinal cord is normal in caliber. Mild
centrilobular emphysema, coronary atherosclerosis, and prior
cholecystectomy are noted.

A tiny central disc protrusion at T7-8, a tiny calcified central
disc protrusion at T11-12, and mild disc bulging at T12-L1 do not
result in spinal cord mass effect or stenosis.
IMPRESSION: 1. Solid C5-C7 ACDF and interval C6-7 posterior fusion without
spinal stenosis. Mild residual left neural foraminal stenosis at
C6-7.
2. Mild spondylosis elsewhere in the cervical and thoracic spine
without stenosis.
3. Aortic Atherosclerosis (WJ94G-HX5.5) and Emphysema (WJ94G-S0N.Q).

## 2022-11-06 DIAGNOSIS — F339 Major depressive disorder, recurrent, unspecified: Secondary | ICD-10-CM | POA: Diagnosis not present

## 2022-11-06 DIAGNOSIS — E1165 Type 2 diabetes mellitus with hyperglycemia: Secondary | ICD-10-CM | POA: Diagnosis not present

## 2022-11-06 DIAGNOSIS — Z299 Encounter for prophylactic measures, unspecified: Secondary | ICD-10-CM | POA: Diagnosis not present

## 2022-12-24 DIAGNOSIS — M47812 Spondylosis without myelopathy or radiculopathy, cervical region: Secondary | ICD-10-CM | POA: Diagnosis not present

## 2022-12-24 DIAGNOSIS — M47816 Spondylosis without myelopathy or radiculopathy, lumbar region: Secondary | ICD-10-CM | POA: Diagnosis not present

## 2022-12-29 ENCOUNTER — Encounter: Payer: Self-pay | Admitting: Plastic Surgery

## 2022-12-29 ENCOUNTER — Ambulatory Visit (INDEPENDENT_AMBULATORY_CARE_PROVIDER_SITE_OTHER): Payer: Self-pay | Admitting: Plastic Surgery

## 2022-12-29 VITALS — BP 141/78 | HR 87

## 2022-12-29 DIAGNOSIS — Z719 Counseling, unspecified: Secondary | ICD-10-CM

## 2022-12-29 NOTE — Progress Notes (Signed)
The patient is interested in a facelift.  We talked about the difference between facelift and neck lift and the combination of the 2.  She is a smoker so she does need to stop smoking prior to any elective surgery on her face.  She needs to be 3 months tobacco free.  This includes vaping.  She has a lot of skin damage from sun.  I have encouraged her to do some laser treatments that will help get her skin ready before jumping into a facelift.  She is interested in doing that and we will get her quotes today at the front desk.  Botulinum Toxin Procedure Note  Procedure: Cosmetic botulinum toxin   Pre-operative Diagnosis: Dynamic rhytides   Post-operative Diagnosis: Same  Complications:  None  Brief history: The patient desires botulinum toxin injection of her forehead. I discussed with the patient this proposed procedure of botulinum toxin injections, which is customized depending on the particular needs of the patient. It is performed on facial rhytids as a temporary correction. The alternatives were discussed with the patient. The risks were addressed including bleeding, scarring, infection, damage to deeper structures, asymmetry, and chronic pain, which may occur infrequently after a procedure. The individual's choice to undergo a surgical procedure is based on the comparison of risks to potential benefits. Other risks include unsatisfactory results, brow ptosis, eyelid ptosis, allergic reaction, temporary paralysis, which should go away with time, bruising, blurring disturbances and delayed healing. Botulinum toxin injections do not arrest the aging process or produce permanent tightening of the eyelid.  Operative intervention maybe necessary to maintain the results of a blepharoplasty or botulinum toxin. The patient understands and wishes to proceed.  Procedure: The area was prepped with alcohol and dried with a clean gauze. Using a clean technique, the botulinum toxin was diluted with 1.25 cc of  preservative-free normal saline which was slowly injected with an 18 gauge needle in a tuberculin syringes.  A 32 gauge needles were then used to inject the botulinum toxin. This mixture allow for an aliquot of 4 units per 0.1 cc in each injection site.    Subsequently the mixture was injected in the glabellar and forehead area with preservation of the temporal branch to the lateral eyebrow as well as into each lateral canthal area beginning from the lateral orbital rim medial to the zygomaticus major in 3 separate areas. A total of 40 Units of botulinum toxin was used. The forehead and glabellar area was injected with care to inject intramuscular only while holding pressure on the supratrochlear vessels in each area during each injection on either side of the medial corrugators. The injection proceeded vertically superiorly to the medial 2/3 of the frontalis muscle and superior 2/3 of the lateral frontalis, again with preservation of the frontal branch. No complications were noted. Light pressure was held for 5 minutes. She was instructed explicitly in post-operative care.  Botox LOT:  [id number]

## 2023-02-18 ENCOUNTER — Encounter (INDEPENDENT_AMBULATORY_CARE_PROVIDER_SITE_OTHER): Payer: Self-pay | Admitting: Gastroenterology

## 2023-02-18 ENCOUNTER — Ambulatory Visit (INDEPENDENT_AMBULATORY_CARE_PROVIDER_SITE_OTHER): Payer: BC Managed Care – PPO | Admitting: Gastroenterology

## 2023-02-18 VITALS — BP 132/74 | HR 108 | Temp 97.3°F | Ht 66.0 in | Wt 166.9 lb

## 2023-02-18 DIAGNOSIS — R110 Nausea: Secondary | ICD-10-CM | POA: Diagnosis not present

## 2023-02-18 DIAGNOSIS — R195 Other fecal abnormalities: Secondary | ICD-10-CM | POA: Diagnosis not present

## 2023-02-18 DIAGNOSIS — R197 Diarrhea, unspecified: Secondary | ICD-10-CM | POA: Insufficient documentation

## 2023-02-18 DIAGNOSIS — R194 Change in bowel habit: Secondary | ICD-10-CM | POA: Diagnosis not present

## 2023-02-18 NOTE — Patient Instructions (Signed)
Perform stool workup If stool testing is unremarkable, we will proceed with diagnostic colonoscopy Continue taking Zofran as needed for nausea.

## 2023-02-18 NOTE — Progress Notes (Signed)
Katrinka Blazing, M.D. Gastroenterology & Hepatology [facility] [address] [place], [place] [address]  Primary Care Physician: Ignatius Specking, MD [address] [place] [address]  I will communicate my assessment and recommendations to the referring MD via EMR.  Problems: Change in bowel habits Chronic nausea  History of Present Illness: AVALEE KAPPEN is a 57 y.o. female with past medical history of anxiety, depression, osteoporosis, borderline DM, who presents for evaluation of mucus in stool and bloody stools.  The patient was last seen on 04/20/2022. At that time, the patient was presenting ongoing nausea and loose stools.  TSH was ordered but this was not performed.  Patient was prescribed omeprazole 40 mg daily and Zofran as needed, as well as continuing the use of Benefiber and Bentyl.  Gastric emptying study was performed on 04/22/2022 for evaluation of early satiety which was within normal limits.  Patient reports that for the last week, she has presented recurrent episodes of defecating mucus and some blood every time she has a BM. She has had this for the last week. Reports that most of the time she does not see stool when she wants  to have a BM, has presented increased frequency. Also has noticed her lower abdominal area has discomfort. States her stools ar soft but is not watery.  Reports that she is still presenting some nausea, and feels maybe is slightly better than before. She takes half a Zofran pill when she is symptomatic, which helps with her symptoms but tries to avoid taking it as much as possible.  The patient denies having any vomiting, fever, chills, melena, hematemesis, abdominal distention, jaundice, pruritus or weight loss.  Upon review of the patient's medication list, she is currently taking meloxicam.  Last colonoscopy: 04/04/20 [facility], unable to review records in EMR Last Endoscopy:-07/30/21- 2 cm hiatal  hernia. - Normal stomach. Biopsied-negative - Normal examined duodenum. Biopsied-negative  Past Medical History: Past Medical History:  Diagnosis Date   Anxiety    Depression    Osteoporosis     Past Surgical History: Past Surgical History:  Procedure Laterality Date   BACK SURGERY     x 3   BIOPSY  07/30/2021   Procedure: BIOPSY;  Surgeon: Marguerita Merles, Reuel Boom, MD;  Location: [facility];  Service: Gastroenterology;;   ENDOMETRIAL ABLATION     ESOPHAGOGASTRODUODENOSCOPY (EGD) WITH PROPOFOL N/A 07/30/2021   Procedure: ESOPHAGOGASTRODUODENOSCOPY (EGD) WITH PROPOFOL;  Surgeon: Dolores Frame, MD;  Location: [facility];  Service: Gastroenterology;  Laterality: N/A;  730   GALLBLADDER SURGERY     HYSTEROTOMY     KNEE SURGERY Left    NECK SURGERY     x 3    Family History: Family History  Problem Relation Age of Onset   Cancer Paternal Grandfather    Heart attack Maternal Grandmother    Heart attack Maternal Grandfather    Other Father        back problems   Alzheimer's disease Mother     Social History: Social History   Tobacco Use  Smoking Status Former   Current packs/day: 0.00   Types: Cigarettes   Start date: 06/1991   Quit date: 06/2021   Years since quitting: 1.7   Passive exposure: Past  Smokeless Tobacco Never   Social History   Substance and Sexual Activity  Alcohol Use Not Currently   Social History   Substance and Sexual Activity  Drug Use Not Currently  Types: Marijuana    Allergies: No Known Allergies  Medications: Current Outpatient Medications  Medication Sig Dispense Refill   dicyclomine (BENTYL) 10 MG capsule TAKE 1 CAPSULE BY MOUTH THREE TIMES DAILY AS NEEDED FOR SPASMS 90 capsule 1   DULoxetine (CYMBALTA) 30 MG capsule Take 30 mg by mouth daily.     meloxicam (MOBIC) 15 MG tablet Take 15 mg by mouth daily.     omeprazole (PRILOSEC) 40 MG capsule Take 1 capsule by mouth once daily 90 capsule 0   ondansetron  (ZOFRAN) 4 MG tablet Take 1 tablet (4 mg total) by mouth every 8 (eight) hours as needed for nausea or vomiting. 30 tablet 1   pregabalin (LYRICA) 75 MG capsule Take 75 mg by mouth 3 (three) times daily.     Rosuvastatin Calcium (CRESTOR PO) Take 20 mg by mouth daily at 6 (six) AM.     No current facility-administered medications for this visit.    Review of Systems: GENERAL: negative for malaise, night sweats HEENT: No changes in hearing or vision, no nose bleeds or other nasal problems. NECK: Negative for lumps, goiter, pain and significant neck swelling RESPIRATORY: Negative for cough, wheezing CARDIOVASCULAR: Negative for chest pain, leg swelling, palpitations, orthopnea GI: SEE HPI MUSCULOSKELETAL: Negative for joint pain or swelling, back pain, and muscle pain. SKIN: Negative for lesions, rash PSYCH: Negative for sleep disturbance, mood disorder and recent psychosocial stressors. HEMATOLOGY Negative for prolonged bleeding, bruising easily, and swollen nodes. ENDOCRINE: Negative for cold or heat intolerance, polyuria, polydipsia and goiter. NEURO: negative for tremor, gait imbalance, syncope and seizures. The remainder of the review of systems is noncontributory.   Physical Exam: BP 132/74 (BP Location: Left Arm, Patient Position: Sitting, Cuff Size: Large)   Pulse (!) 108   Temp (!) 97.3 F (36.3 C) (Temporal)   Ht 5\' 6"  (1.676 m)   Wt 166 lb 14.4 oz (75.7 kg)   BMI 26.94 kg/m  GENERAL: The patient is AO x3, in no acute distress. HEENT: Head is normocephalic and atraumatic. EOMI are intact. Mouth is well hydrated and without lesions. NECK: Supple. No masses LUNGS: Clear to auscultation. No presence of rhonchi/wheezing/rales. Adequate chest expansion HEART: RRR, normal s1 and s2. ABDOMEN: Soft, nontender, no guarding, no peritoneal signs, and nondistended. BS +. No masses. EXTREMITIES: Without any cyanosis, clubbing, rash, lesions or edema. NEUROLOGIC: AOx3, no focal motor  deficit. SKIN: no jaundice, no rashes  Imaging/Labs: as above  I personally reviewed and interpreted the available labs, imaging and endoscopic files.  Impression and Plan: YAZLYN PROCK is a 57 y.o. female with past medical history of anxiety, depression, osteoporosis, borderline DM, who presents for evaluation of mucus in stool and bloody stools.  The patient has presented new onset of changes in her bowel movement consistency, as well as presence of some blood in her stool.  It is not clear why she has presented this but given the acute nature of her symptoms, we will evaluate this further with stool testing to rule out infection.  If this testing is negative, we will proceed with a colonoscopy given the presence of rectal bleeding.  Regarding her nausea, she has presented some improvement with the use of Zofran, which she should continue for now.  -Check C. difficile and GI pathogen panel and stool -If stool testing is unremarkable, we will proceed with diagnostic colonoscopy -Continue taking Zofran as needed for nausea.  All questions were answered.      Katrinka Blazing, MD Gastroenterology  and Hepatology [facility]

## 2023-02-20 LAB — C. DIFFICILE GDH AND TOXIN A/B
GDH ANTIGEN: NOT DETECTED
MICRO NUMBER:: 15609579
SPECIMEN QUALITY:: ADEQUATE
TOXIN A AND B: NOT DETECTED

## 2023-02-20 LAB — GASTROINTESTINAL PATHOGEN PNL
CampyloBacter Group: NOT DETECTED
Norovirus GI/GII: NOT DETECTED
Rotavirus A: NOT DETECTED
Salmonella species: NOT DETECTED
Shiga Toxin 1: NOT DETECTED
Shiga Toxin 2: NOT DETECTED
Shigella Species: NOT DETECTED
Vibrio Group: NOT DETECTED
Yersinia enterocolitica: NOT DETECTED

## 2023-02-22 ENCOUNTER — Telehealth (INDEPENDENT_AMBULATORY_CARE_PROVIDER_SITE_OTHER): Payer: Self-pay | Admitting: *Deleted

## 2023-02-22 MED ORDER — PEG 3350-KCL-NA BICARB-NACL 420 G PO SOLR: 4000.0000 mL | Freq: Once | ORAL | 0 refills | Status: AC

## 2023-02-22 NOTE — Telephone Encounter (Signed)
Pt requesting refill on dicyclomine, omeprazole and zofran to [facility]

## 2023-02-22 NOTE — Telephone Encounter (Signed)
Pt left vm that [facility] did not have her medication. She did not leave name of med. Tried to call and no answer.   [phone]

## 2023-02-22 NOTE — Addendum Note (Signed)
Addended by: Marlowe Shores on: 02/22/2023 01:52 PM   Modules accepted: Orders

## 2023-02-23 ENCOUNTER — Other Ambulatory Visit (INDEPENDENT_AMBULATORY_CARE_PROVIDER_SITE_OTHER): Payer: Self-pay | Admitting: Gastroenterology

## 2023-02-23 MED ORDER — OMEPRAZOLE 40 MG PO CPDR: 40.0000 mg | DELAYED_RELEASE_CAPSULE | Freq: Every day | ORAL | 3 refills | Status: AC

## 2023-02-23 MED ORDER — ONDANSETRON HCL 4 MG PO TABS: 4.0000 mg | ORAL_TABLET | Freq: Three times a day (TID) | ORAL | 1 refills | Status: AC | PRN

## 2023-02-23 MED ORDER — DICYCLOMINE HCL 10 MG PO CAPS: 10.0000 mg | ORAL_CAPSULE | Freq: Three times a day (TID) | ORAL | 1 refills | Status: AC | PRN

## 2023-02-23 NOTE — Telephone Encounter (Signed)
Tried to call no answer

## 2023-02-23 NOTE — Telephone Encounter (Signed)
Pt.notified

## 2023-03-08 ENCOUNTER — Telehealth (INDEPENDENT_AMBULATORY_CARE_PROVIDER_SITE_OTHER): Payer: Self-pay | Admitting: Gastroenterology

## 2023-03-08 NOTE — Telephone Encounter (Signed)
Pt husband left another message. Tried to reach Kathryn Berry (husband) but phone went straight to voicemail. Called pt mobile number no answer. Called home phone and was able to speak to Kathryn Berry. Kathryn Berry states they received a call from [facility] stating TCS would be $4800. Pt question was could they go somewhere and have it done cheaper. Advised pt husband that they would have to see another GI since we only schedule for our providers at [facility]. Pt husband would like to cancel TCS for this week. Pt husband will call back to reschedule if he can not find any where cheaper.

## 2023-03-08 NOTE — Telephone Encounter (Signed)
Pt husband Aurther Loft left voicemail asking for a call back due to having questions about pt procedure next week. Returned call to husband but no answer and no voicemail set up

## 2023-03-09 DIAGNOSIS — R110 Nausea: Secondary | ICD-10-CM | POA: Diagnosis not present

## 2023-03-09 DIAGNOSIS — K5900 Constipation, unspecified: Secondary | ICD-10-CM | POA: Diagnosis not present

## 2023-03-09 DIAGNOSIS — K625 Hemorrhage of anus and rectum: Secondary | ICD-10-CM | POA: Diagnosis not present

## 2023-03-09 DIAGNOSIS — R140 Abdominal distension (gaseous): Secondary | ICD-10-CM | POA: Diagnosis not present

## 2023-03-10 ENCOUNTER — Ambulatory Visit (HOSPITAL_COMMUNITY)
Admission: RE | Admit: 2023-03-10 | Payer: BC Managed Care – PPO | Source: Home / Self Care | Admitting: Gastroenterology

## 2023-03-10 ENCOUNTER — Encounter (HOSPITAL_COMMUNITY): Admission: RE | Payer: Self-pay | Source: Home / Self Care

## 2023-03-10 SURGERY — COLONOSCOPY WITH PROPOFOL
Anesthesia: Monitor Anesthesia Care

## 2023-04-06 ENCOUNTER — Ambulatory Visit: Payer: BC Managed Care – PPO | Admitting: Plastic Surgery

## 2023-04-08 ENCOUNTER — Ambulatory Visit (INDEPENDENT_AMBULATORY_CARE_PROVIDER_SITE_OTHER): Payer: Self-pay | Admitting: Plastic Surgery

## 2023-04-08 ENCOUNTER — Encounter: Payer: Self-pay | Admitting: Plastic Surgery

## 2023-04-08 DIAGNOSIS — Z719 Counseling, unspecified: Secondary | ICD-10-CM

## 2023-04-08 NOTE — Progress Notes (Signed)
Preoperative Dx: ageing and hyperpigmentation of face, neck and chest  Postoperative Dx:  same  Procedure: laser to face, neck and chest   Anesthesia: none  Description of Procedure:  Risks and complications were explained to the patient. Consent was confirmed and signed. Eye protection was placed. Time out was called and all information was confirmed to be correct. The area  area was prepped with alcohol and wiped dry. The Moxi laser was set at 15 mJ. The face, neck and chest were lasered all with 3 passes min. The patient tolerated the procedure well and there were no complications. The patient is to follow up in 4 weeks.

## 2023-04-09 ENCOUNTER — Ambulatory Visit: Payer: BC Managed Care – PPO | Admitting: Plastic Surgery

## 2023-04-19 ENCOUNTER — Ambulatory Visit (INDEPENDENT_AMBULATORY_CARE_PROVIDER_SITE_OTHER): Payer: BC Managed Care – PPO | Admitting: Gastroenterology

## 2023-05-18 ENCOUNTER — Ambulatory Visit: Payer: BC Managed Care – PPO | Admitting: Plastic Surgery

## 2024-02-28 ENCOUNTER — Other Ambulatory Visit (INDEPENDENT_AMBULATORY_CARE_PROVIDER_SITE_OTHER): Payer: Self-pay | Admitting: Gastroenterology

## 2024-02-28 NOTE — Telephone Encounter (Signed)
 Last seen 02/2023. Needs office visit.

## 2024-03-08 ENCOUNTER — Encounter (INDEPENDENT_AMBULATORY_CARE_PROVIDER_SITE_OTHER): Payer: Self-pay | Admitting: Gastroenterology
